# Patient Record
Sex: Female | Born: 1980 | Race: Black or African American | Hispanic: No | Smoking: Never smoker
Health system: Southern US, Community
[De-identification: ages and names within clinical notes are randomized; demographics above are authoritative.]

## PROBLEM LIST (undated history)

## (undated) DIAGNOSIS — M25839 Other specified joint disorders, unspecified wrist: Secondary | ICD-10-CM

## (undated) DIAGNOSIS — G43909 Migraine, unspecified, not intractable, without status migrainosus: Secondary | ICD-10-CM

## (undated) DIAGNOSIS — E282 Polycystic ovarian syndrome: Secondary | ICD-10-CM

## (undated) DIAGNOSIS — I10 Essential (primary) hypertension: Secondary | ICD-10-CM

## (undated) DIAGNOSIS — M549 Dorsalgia, unspecified: Secondary | ICD-10-CM

## (undated) HISTORY — PX: LAPAROSCOPIC BILATERAL SALPINGO OOPHERECTOMY: SHX5890

## (undated) HISTORY — DX: Migraine, unspecified, not intractable, without status migrainosus: G43.909

## (undated) HISTORY — DX: Dorsalgia, unspecified: M54.9

## (undated) HISTORY — PX: REDUCTION MAMMAPLASTY: SUR839

## (undated) HISTORY — DX: Polycystic ovarian syndrome: E28.2

---

## 2005-11-29 ENCOUNTER — Observation Stay: Payer: Self-pay | Admitting: Obstetrics and Gynecology

## 2005-12-06 ENCOUNTER — Observation Stay: Payer: Self-pay | Admitting: Obstetrics and Gynecology

## 2007-03-20 ENCOUNTER — Emergency Department: Payer: Self-pay | Admitting: Emergency Medicine

## 2007-05-31 ENCOUNTER — Emergency Department: Payer: Self-pay | Admitting: Emergency Medicine

## 2008-03-25 ENCOUNTER — Emergency Department: Payer: Self-pay | Admitting: Emergency Medicine

## 2008-09-18 ENCOUNTER — Emergency Department: Payer: Self-pay | Admitting: Internal Medicine

## 2010-05-12 ENCOUNTER — Emergency Department: Payer: Self-pay | Admitting: Emergency Medicine

## 2010-06-03 ENCOUNTER — Emergency Department: Payer: Self-pay | Admitting: Emergency Medicine

## 2011-05-04 ENCOUNTER — Emergency Department: Payer: Self-pay | Admitting: Emergency Medicine

## 2012-09-06 ENCOUNTER — Emergency Department: Payer: Self-pay | Admitting: Emergency Medicine

## 2013-08-08 ENCOUNTER — Ambulatory Visit: Payer: Worker's Compensation

## 2013-08-08 ENCOUNTER — Other Ambulatory Visit: Payer: Self-pay | Admitting: Occupational Medicine

## 2013-08-08 DIAGNOSIS — M25531 Pain in right wrist: Secondary | ICD-10-CM

## 2013-11-29 ENCOUNTER — Other Ambulatory Visit: Payer: Self-pay | Admitting: Orthopedic Surgery

## 2013-12-07 DIAGNOSIS — M25839 Other specified joint disorders, unspecified wrist: Secondary | ICD-10-CM

## 2013-12-07 HISTORY — DX: Other specified joint disorders, unspecified wrist: M25.839

## 2013-12-18 ENCOUNTER — Encounter (HOSPITAL_BASED_OUTPATIENT_CLINIC_OR_DEPARTMENT_OTHER): Payer: Self-pay | Admitting: *Deleted

## 2013-12-21 ENCOUNTER — Ambulatory Visit (HOSPITAL_BASED_OUTPATIENT_CLINIC_OR_DEPARTMENT_OTHER)
Admission: RE | Admit: 2013-12-21 | Discharge: 2013-12-21 | Disposition: A | Discharge status: Home / Self Care | Payer: Worker's Compensation | Source: Ambulatory Visit | Attending: Orthopedic Surgery | Admitting: Orthopedic Surgery

## 2013-12-21 ENCOUNTER — Encounter (HOSPITAL_BASED_OUTPATIENT_CLINIC_OR_DEPARTMENT_OTHER): Payer: Worker's Compensation | Admitting: Anesthesiology

## 2013-12-21 ENCOUNTER — Encounter (HOSPITAL_BASED_OUTPATIENT_CLINIC_OR_DEPARTMENT_OTHER)
Admission: RE | Disposition: A | Discharge status: Home / Self Care | Payer: Self-pay | Source: Ambulatory Visit | Attending: Orthopedic Surgery

## 2013-12-21 ENCOUNTER — Ambulatory Visit (HOSPITAL_BASED_OUTPATIENT_CLINIC_OR_DEPARTMENT_OTHER): Payer: Worker's Compensation | Admitting: Anesthesiology

## 2013-12-21 ENCOUNTER — Encounter (HOSPITAL_BASED_OUTPATIENT_CLINIC_OR_DEPARTMENT_OTHER): Payer: Self-pay | Admitting: *Deleted

## 2013-12-21 DIAGNOSIS — M25539 Pain in unspecified wrist: Secondary | ICD-10-CM | POA: Insufficient documentation

## 2013-12-21 DIAGNOSIS — S63599A Other specified sprain of unspecified wrist, initial encounter: Secondary | ICD-10-CM | POA: Insufficient documentation

## 2013-12-21 DIAGNOSIS — X58XXXA Exposure to other specified factors, initial encounter: Secondary | ICD-10-CM | POA: Insufficient documentation

## 2013-12-21 DIAGNOSIS — S66819A Strain of other specified muscles, fascia and tendons at wrist and hand level, unspecified hand, initial encounter: Principal | ICD-10-CM

## 2013-12-21 HISTORY — PX: ULNA OSTEOTOMY: SHX1077

## 2013-12-21 HISTORY — PX: WRIST ARTHROSCOPY WITH FOVEAL TRIANGULAR FIBROCARTILAGE COMPLEX REPAIR: SHX6403

## 2013-12-21 HISTORY — DX: Other specified joint disorders, unspecified wrist: M25.839

## 2013-12-21 LAB — POCT HEMOGLOBIN-HEMACUE: Hemoglobin: 12.9 g/dL (ref 12.0–15.0)

## 2013-12-21 SURGERY — WRIST ARTHROSCOPY WITH FOVEAL TRIANGULAR FIBROCARTILAGE COMPLEX REPAIR
Anesthesia: Regional | Site: Wrist | Laterality: Right

## 2013-12-21 MED ORDER — ROPIVACAINE HCL 5 MG/ML IJ SOLN
INTRAMUSCULAR | Status: DC | PRN
  Administered 2013-12-21: 25 mL via PERINEURAL

## 2013-12-21 MED ORDER — CEFAZOLIN SODIUM-DEXTROSE 2-3 GM-% IV SOLR: 2.0000 g | INTRAVENOUS | Status: DC

## 2013-12-21 MED ORDER — ONDANSETRON HCL 4 MG/2ML IJ SOLN
INTRAMUSCULAR | Status: DC | PRN
  Administered 2013-12-21: 4 mg via INTRAVENOUS

## 2013-12-21 MED ORDER — FENTANYL CITRATE 0.05 MG/ML IJ SOLN
INTRAMUSCULAR | Status: AC
  Filled 2013-12-21: qty 2

## 2013-12-21 MED ORDER — PROMETHAZINE HCL 25 MG/ML IJ SOLN: 6.2500 mg | INTRAMUSCULAR | Status: DC | PRN

## 2013-12-21 MED ORDER — MIDAZOLAM HCL 2 MG/ML PO SYRP: 12.0000 mg | ORAL_SOLUTION | Freq: Once | ORAL | Status: DC | PRN

## 2013-12-21 MED ORDER — CEFAZOLIN SODIUM-DEXTROSE 2-3 GM-% IV SOLR
2.0000 g | INTRAVENOUS | Status: AC
  Administered 2013-12-21: 2 g via INTRAVENOUS

## 2013-12-21 MED ORDER — OXYCODONE HCL 5 MG PO TABS: 5.0000 mg | ORAL_TABLET | Freq: Once | ORAL | Status: DC | PRN

## 2013-12-21 MED ORDER — KETOROLAC TROMETHAMINE 30 MG/ML IJ SOLN: 15.0000 mg | Freq: Once | INTRAMUSCULAR | Status: DC | PRN

## 2013-12-21 MED ORDER — MIDAZOLAM HCL 2 MG/2ML IJ SOLN
INTRAMUSCULAR | Status: AC
  Filled 2013-12-21: qty 2

## 2013-12-21 MED ORDER — OXYCODONE HCL 5 MG/5ML PO SOLN: 5.0000 mg | Freq: Once | ORAL | Status: DC | PRN

## 2013-12-21 MED ORDER — LIDOCAINE HCL (CARDIAC) 20 MG/ML IV SOLN
INTRAVENOUS | Status: DC | PRN
  Administered 2013-12-21: 60 mg via INTRAVENOUS

## 2013-12-21 MED ORDER — OXYCODONE-ACETAMINOPHEN 10-325 MG PO TABS: 1.0000 | ORAL_TABLET | ORAL | Status: DC | PRN

## 2013-12-21 MED ORDER — FENTANYL CITRATE 0.05 MG/ML IJ SOLN
50.0000 ug | INTRAMUSCULAR | Status: DC | PRN
  Administered 2013-12-21: 100 ug via INTRAVENOUS

## 2013-12-21 MED ORDER — MIDAZOLAM HCL 2 MG/2ML IJ SOLN
1.0000 mg | INTRAMUSCULAR | Status: DC | PRN
Start: 2013-12-21 — End: 2013-12-21
  Administered 2013-12-21: 2 mg via INTRAVENOUS

## 2013-12-21 MED ORDER — CHLORHEXIDINE GLUCONATE 4 % EX LIQD: 60.0000 mL | Freq: Once | CUTANEOUS | Status: DC

## 2013-12-21 MED ORDER — DEXAMETHASONE SODIUM PHOSPHATE 10 MG/ML IJ SOLN
INTRAMUSCULAR | Status: DC | PRN
  Administered 2013-12-21: 10 mg via INTRAVENOUS

## 2013-12-21 MED ORDER — PROPOFOL 10 MG/ML IV BOLUS
INTRAVENOUS | Status: DC | PRN
  Administered 2013-12-21: 180 mg via INTRAVENOUS

## 2013-12-21 MED ORDER — ACETAMINOPHEN 160 MG/5ML PO SOLN: 325.0000 mg | ORAL | Status: DC | PRN

## 2013-12-21 MED ORDER — FENTANYL CITRATE 0.05 MG/ML IJ SOLN: 25.0000 ug | INTRAMUSCULAR | Status: DC | PRN

## 2013-12-21 MED ORDER — ACETAMINOPHEN 325 MG PO TABS: 325.0000 mg | ORAL_TABLET | ORAL | Status: DC | PRN

## 2013-12-21 MED ORDER — CEFAZOLIN SODIUM-DEXTROSE 2-3 GM-% IV SOLR
INTRAVENOUS | Status: AC
  Filled 2013-12-21: qty 50

## 2013-12-21 MED ORDER — LACTATED RINGERS IV SOLN
INTRAVENOUS | Status: DC
  Administered 2013-12-21 (×2): via INTRAVENOUS

## 2013-12-21 MED ORDER — EPHEDRINE SULFATE 50 MG/ML IJ SOLN
INTRAMUSCULAR | Status: DC | PRN
  Administered 2013-12-21: 10 mg via INTRAVENOUS

## 2013-12-21 SURGICAL SUPPLY — 109 items
BIT DRILL 2.8X5 QR DISP (BIT) ×3 IMPLANT
BIT DRILL QUICK RELEASE 3.5MM (BIT) ×1 IMPLANT
BLADE AVERAGE 25MMX9MM (BLADE)
BLADE AVERAGE 25X9 (BLADE) IMPLANT
BLADE CUDA 2.0 (BLADE) IMPLANT
BLADE EAR TYMPAN 2.5 60D BEAV (BLADE) IMPLANT
BLADE MINI RND TIP GREEN BEAV (BLADE) IMPLANT
BLADE SAW OSTEOTOMY (BLADE) ×3 IMPLANT
BLADE SURG 15 STRL LF DISP TIS (BLADE) ×1 IMPLANT
BLADE SURG 15 STRL SS (BLADE) ×2
BNDG COHESIVE 3X5 TAN STRL LF (GAUZE/BANDAGES/DRESSINGS) ×6 IMPLANT
BNDG ESMARK 4X9 LF (GAUZE/BANDAGES/DRESSINGS) ×3 IMPLANT
BNDG GAUZE ELAST 4 BULKY (GAUZE/BANDAGES/DRESSINGS) ×3 IMPLANT
BONE EVOLUTION TRINITY 1CC (Bone Implant) ×3 IMPLANT
BUR CUDA 2.9 (BURR) IMPLANT
BUR CUDA 2.9MM (BURR)
BUR EGG 3PK/BX (BURR) IMPLANT
BUR FULL RADIUS 2.0 (BURR) IMPLANT
BUR FULL RADIUS 2.0MM (BURR)
BUR FULL RADIUS 2.9 (BURR) IMPLANT
BUR FULL RADIUS 2.9MM (BURR)
BUR GATOR 2.9 (BURR) IMPLANT
BUR GATOR 2.9MM (BURR)
BUR SPHERICAL 2.9 (BURR) IMPLANT
BUR SPHERICAL 2.9MM (BURR)
CANISTER SUCT 1200ML W/VALVE (MISCELLANEOUS) IMPLANT
CANISTER SUCT 3000ML (MISCELLANEOUS) IMPLANT
CHLORAPREP W/TINT 26ML (MISCELLANEOUS) ×3 IMPLANT
CORDS BIPOLAR (ELECTRODE) ×3 IMPLANT
COVER MAYO STAND STRL (DRAPES) ×3 IMPLANT
COVER TABLE BACK 60X90 (DRAPES) ×3 IMPLANT
CUFF TOURNIQUET SINGLE 18IN (TOURNIQUET CUFF) ×3 IMPLANT
DECANTER SPIKE VIAL GLASS SM (MISCELLANEOUS) IMPLANT
DRAIN PENROSE 1/2X12 LTX STRL (WOUND CARE) IMPLANT
DRAPE EXTREMITY T 121X128X90 (DRAPE) ×3 IMPLANT
DRAPE OEC MINIVIEW 54X84 (DRAPES) ×3 IMPLANT
DRAPE SURG 17X23 STRL (DRAPES) ×3 IMPLANT
DRAPE U 20/CS (DRAPES) ×3 IMPLANT
DRILL QUICK RELEASE 3.5MM (BIT) ×3
DRSG KUZMA FLUFF (GAUZE/BANDAGES/DRESSINGS) IMPLANT
ELECT SMALL JOINT 90D BASC (ELECTRODE) IMPLANT
GAUZE SPONGE 4X4 12PLY STRL (GAUZE/BANDAGES/DRESSINGS) ×3 IMPLANT
GAUZE SPONGE 4X4 16PLY XRAY LF (GAUZE/BANDAGES/DRESSINGS) ×3 IMPLANT
GAUZE XEROFORM 1X8 LF (GAUZE/BANDAGES/DRESSINGS) ×3 IMPLANT
GLOVE BIO SURGEON STRL SZ7.5 (GLOVE) ×6 IMPLANT
GLOVE BIOGEL M STRL SZ7.5 (GLOVE) ×6 IMPLANT
GLOVE BIOGEL PI IND STRL 7.0 (GLOVE) ×1 IMPLANT
GLOVE BIOGEL PI IND STRL 8 (GLOVE) ×1 IMPLANT
GLOVE BIOGEL PI IND STRL 8.5 (GLOVE) ×1 IMPLANT
GLOVE BIOGEL PI INDICATOR 7.0 (GLOVE) ×2
GLOVE BIOGEL PI INDICATOR 8 (GLOVE) ×2
GLOVE BIOGEL PI INDICATOR 8.5 (GLOVE) ×2
GLOVE ECLIPSE 6.5 STRL STRAW (GLOVE) ×6 IMPLANT
GLOVE SURG ORTHO 8.0 STRL STRW (GLOVE) ×6 IMPLANT
GOWN STRL REUS W/ TWL LRG LVL3 (GOWN DISPOSABLE) ×1 IMPLANT
GOWN STRL REUS W/TWL LRG LVL3 (GOWN DISPOSABLE) ×2
GOWN STRL REUS W/TWL XL LVL3 (GOWN DISPOSABLE) ×9 IMPLANT
GUIDEWIRE ORTHO 0.054X6 (WIRE) ×6 IMPLANT
IV NS IRRIG 3000ML ARTHROMATIC (IV SOLUTION) ×3 IMPLANT
KIT MINI BIO ANCHOR DRILL (KITS) IMPLANT
MANIFOLD NEPTUNE II (INSTRUMENTS) IMPLANT
NDL SAFETY ECLIPSE 18X1.5 (NEEDLE) ×1 IMPLANT
NEEDLE HYPO 18GX1.5 SHARP (NEEDLE) ×2
NEEDLE HYPO 22GX1.5 SAFETY (NEEDLE) ×3 IMPLANT
NEEDLE SPNL 18GX3.5 QUINCKE PK (NEEDLE) IMPLANT
NEEDLE TUOHY 20GX3.5 (NEEDLE) IMPLANT
NS IRRIG 1000ML POUR BTL (IV SOLUTION) ×3 IMPLANT
PACK BASIN DAY SURGERY FS (CUSTOM PROCEDURE TRAY) ×3 IMPLANT
PAD CAST 3X4 CTTN HI CHSV (CAST SUPPLIES) ×1 IMPLANT
PAD CAST 4YDX4 CTTN HI CHSV (CAST SUPPLIES) ×1 IMPLANT
PADDING CAST ABS 3INX4YD NS (CAST SUPPLIES) ×2
PADDING CAST ABS 4INX4YD NS (CAST SUPPLIES)
PADDING CAST ABS COTTON 3X4 (CAST SUPPLIES) ×1 IMPLANT
PADDING CAST ABS COTTON 4X4 ST (CAST SUPPLIES) IMPLANT
PADDING CAST COTTON 3X4 STRL (CAST SUPPLIES) ×2
PADDING CAST COTTON 4X4 STRL (CAST SUPPLIES) ×2
PLATE ULNAR SHORTENING (Plate) ×3 IMPLANT
ROUTER HOODED VORTEX 2.9MM (BLADE) IMPLANT
SCREW HEXALOBE NON-LOCK 3.5X16 (Screw) ×3 IMPLANT
SCREW NLCKG 13 3.5X13 HEXA (Screw) ×2 IMPLANT
SCREW NON-LOCK 3.5X13 (Screw) ×6 IMPLANT
SCREW NONLOCK HEX 3.5X12 (Screw) ×12 IMPLANT
SET ARTHROSCOPY TUBING (MISCELLANEOUS) ×2
SET ARTHROSCOPY TUBING LN (MISCELLANEOUS) ×1 IMPLANT
SET EXT MALE ROTATING LL 32IN (MISCELLANEOUS) ×3 IMPLANT
SET SM JOINT TUBING/CANN (CANNULA) IMPLANT
SLEEVE SCD COMPRESS KNEE MED (MISCELLANEOUS) ×3 IMPLANT
SLING ARM LRG ADULT FOAM STRAP (SOFTGOODS) IMPLANT
SLING ARM MED ADULT FOAM STRAP (SOFTGOODS) IMPLANT
SPLINT PLASTER CAST XFAST 3X15 (CAST SUPPLIES) ×30 IMPLANT
SPLINT PLASTER XTRA FASTSET 3X (CAST SUPPLIES) ×60
STOCKINETTE 4X48 STRL (DRAPES) ×9 IMPLANT
SUCTION FRAZIER TIP 10 FR DISP (SUCTIONS) IMPLANT
SUT MERSILENE 4 0 P 3 (SUTURE) IMPLANT
SUT PDS AB 2-0 CT2 27 (SUTURE) IMPLANT
SUT STEEL 4 0 (SUTURE) IMPLANT
SUT VIC AB 2-0 PS2 27 (SUTURE) ×3 IMPLANT
SUT VICRYL 4-0 PS2 18IN ABS (SUTURE) ×3 IMPLANT
SUT VICRYL RAPID 5 0 P 3 (SUTURE) IMPLANT
SUT VICRYL RAPIDE 4/0 PS 2 (SUTURE) ×3 IMPLANT
SYR BULB 3OZ (MISCELLANEOUS) ×3 IMPLANT
SYR CONTROL 10ML LL (SYRINGE) ×3 IMPLANT
TOWEL OR 17X24 6PK STRL BLUE (TOWEL DISPOSABLE) ×3 IMPLANT
TUBE CONNECTING 20'X1/4 (TUBING)
TUBE CONNECTING 20X1/4 (TUBING) IMPLANT
UNDERPAD 30X30 INCONTINENT (UNDERPADS AND DIAPERS) ×3 IMPLANT
WAND 1.5 MICROBLATOR (SURGICAL WAND) ×3 IMPLANT
WATER STERILE IRR 1000ML POUR (IV SOLUTION) ×3 IMPLANT
WIRE TACK PLATE PL-PTACK (WIRE) ×3 IMPLANT

## 2013-12-21 NOTE — Discharge Instructions (Addendum)
[facility] Instructions °Hand Surgery ° °Wound Care: °Keep your hand elevated above the level of your heart.  Do not allow it to dangle by your side.  Keep the dressing dry and do not remove it unless your doctor advises you to do so.  He will usually change it at the time of your post-op visit.  Moving your fingers is advised to stimulate circulation but will depend on the site of your surgery.  If you have a splint applied, your doctor will advise you regarding movement. ° °Activity: °Do not drive or operate machinery today.  Rest today and then you may return to your normal activity and work as indicated by your physician. ° °Diet:  °Drink liquids today or eat a light diet.  You may resume a regular diet tomorrow.   ° °General expectations: °Pain for two to three days. °Fingers may become slightly swollen. ° °Call your doctor if any of the following occur: °Severe pain not relieved by pain medication. °Elevated temperature. °Dressing soaked with blood. °Inability to move fingers. °White or bluish color to fingers. ° ° °Post Anesthesia Home Care Instructions ° °Activity: °Get plenty of rest for the remainder of the day. A responsible adult should stay with you for 24 hours following the procedure.  °For the next 24 hours, DO NOT: °-Drive a car °-Operate machinery °-Drink alcoholic beverages °-Take any medication unless instructed by your physician °-Make any legal decisions or sign important papers. ° °Meals: °Start with liquid foods such as gelatin or soup. Progress to regular foods as tolerated. Avoid greasy, spicy, heavy foods. If nausea and/or vomiting occur, drink only clear liquids until the nausea and/or vomiting subsides. Call your physician if vomiting continues. ° °Special Instructions/Symptoms: °Your throat may feel dry or sore from the anesthesia or the breathing tube placed in your throat during surgery. If this causes discomfort, gargle with warm salt water. The discomfort should disappear within 24  hours. ° ° °Regional Anesthesia Blocks ° °1. Numbness or the inability to move the "blocked" extremity may last from 3-48 hours after placement. The length of time depends on the medication injected and your individual response to the medication. If the numbness is not going away after 48 hours, call your surgeon. ° °2. The extremity that is blocked will need to be protected until the numbness is gone and the  Strength has returned. Because you cannot feel it, you will need to take extra care to avoid injury. Because it may be weak, you may have difficulty moving it or using it. You may not know what position it is in without looking at it while the block is in effect. ° °3. For blocks in the legs and feet, returning to weight bearing and walking needs to be done carefully. You will need to wait until the numbness is entirely gone and the strength has returned. You should be able to move your leg and foot normally before you try and bear weight or walk. You will need someone to be with you when you first try to ensure you do not fall and possibly risk injury. ° °4. Bruising and tenderness at the needle site are common side effects and will resolve in a few days. ° °5. Persistent numbness or new problems with movement should be communicated to the surgeon or the [facility] ([phone])/ [facility] ([phone]). °

## 2013-12-21 NOTE — Anesthesia Preprocedure Evaluation (Signed)
Anesthesia Evaluation  Patient identified by MRN, date of birth, ID band Patient awake    Reviewed: Allergy & Precautions, H&P , NPO status , Patient's Chart, lab work & pertinent test results  History of Anesthesia Complications Negative for: history of anesthetic complications  Airway Mallampati: III TM Distance: >3 FB Neck ROM: Full    Dental  (+) Teeth Intact   Pulmonary neg pulmonary ROS,          Cardiovascular negative cardio ROS  Rhythm:Regular     Neuro/Psych negative neurological ROS     GI/Hepatic negative GI ROS, Neg liver ROS,   Endo/Other  Morbid obesity  Renal/GU negative Renal ROS     Musculoskeletal   Abdominal   Peds  Hematology negative hematology ROS (+)   Anesthesia Other Findings   Reproductive/Obstetrics                           Anesthesia Physical Anesthesia Plan  ASA: II  Anesthesia Plan: General and Regional   Post-op Pain Management:    Induction: Intravenous  Airway Management Planned: LMA  Additional Equipment: None  Intra-op Plan:   Post-operative Plan: Extubation in OR  Informed Consent: I have reviewed the patients History and Physical, chart, labs and discussed the procedure including the risks, benefits and alternatives for the proposed anesthesia with the patient or authorized representative who has indicated his/her understanding and acceptance.   Dental advisory given  Plan Discussed with: CRNA and Surgeon  Anesthesia Plan Comments:         Anesthesia Quick Evaluation

## 2013-12-21 NOTE — H&P (Signed)
Ms. Martha Grant is a 33 year-old right-hand dominant female who suffered an injury to her right wrist in July 23, 2013.  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  She localizes this to the central aspect.  She has been seen and followed by [facility].  She was given a splint to wear at work, therapy and ibuprofen which has given her some relief, but she continues to complain of pain and has been referred.  She has no prior history of injury, no history of diabetes, thyroid problems, arthritis or gout.  There is a family history of diabetes, she has not been tested.  She complains of an extremely severe sharp pain, it does not awaken her at night.  She is not complaining of any numbness or tingling.  She has no complaints with her left side.  She has had her MRI done and this reveals that she has an ulnocarpal abutment, tear of the triangular fibrocartilage, the ulna is long, the lunotriquetral joint appears intact along with scapholunate ligament.    ALLERGIES:        None. MEDICATIONS:        None. SURGICAL HISTORY:      C-sections.  FAMILY MEDICAL HISTORY:      Positive for diabetes, high blood pressure, otherwise negative. SOCIAL HISTORY:       She does not smoke or drink.  She is single, works as a LawyerCNA. REVIEW OF SYSTEMS:       Negative 14 points Martha Grant is an 33 y.o. female.   Chief Complaint: Ulno-carpal abutment right wrist HPI: see above  Past Medical History  Diagnosis Date  . 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 Cesarean section      x 2    History reviewed. No pertinent family history. Social History:  reports that she has never smoked. She has never used smokeless tobacco. She reports that she does not drink alcohol or use illicit drugs.  Allergies: No Known Allergies  Medications Prior to Admission  Medication Sig Dispense Refill  . ibuprofen  (ADVIL,MOTRIN) 200 MG tablet Take 200 mg by mouth every 6 (six) hours as needed.        Results for orders placed during the hospital encounter of 12/21/13 (from the past 48 hour(s))  POCT HEMOGLOBIN-HEMACUE     Status: None   Collection Time    12/21/13 11:01 AM      Result Value Ref Range   Hemoglobin 12.9  12.0 - 15.0 g/dL    No results found.   Pertinent items are noted in HPI.  Blood pressure 129/74, pulse 58, temperature 98.1 F (36.7 C), temperature source Oral, resp. rate 19, height 5\' 4"  (1.626 m), weight 98.431 kg (217 lb), last menstrual period 12/18/2013, SpO2 100.00%.  General appearance: alert, cooperative and appears stated age Head: Normocephalic, without obvious abnormality Neck: no JVD Resp: clear to auscultation bilaterally Cardio: regular rate and rhythm, S1, S2 normal, no murmur, click, rub or gallop GI: soft, non-tender; bowel sounds normal; no masses,  no organomegaly Extremities: extremities normal, atraumatic, no cyanosis or edema Pulses: 2+ and symmetric Skin: Skin color, texture, turgor normal. No rashes or lesions Neurologic: Grossly normal Incision/Wound: na  Assessment/Plan  We have had a long discussion with respect to ulnocarpal abutment, its etiology, treatment is ulnar shortening osteotomy after arthroscopic debridement  to be certain she does not have further injuries especially to the lunotriquetral joint.    We have discussed the pre, peri and postoperative course, the risks and complications with the major risk being delayed or nonunion of the osteotomy.  She states that if she lifts things she will frequently get a pain going down her ulna.  She is advised that the length discrepancy was not caused by the injury, that this is hereditary, but that the injury has aggravated her problem.  She is also aware of potential for injury to arteries, nerves and tendons, incomplete relief of symptoms, dystrophy, the possibility of infection,  nonunion Nicki ReaperGary R Elene Downum 12/21/2013, 12:28 PM

## 2013-12-21 NOTE — Op Note (Signed)
Dictation Number [id number] Intra-operative fluoroscopic images in the AP, lateral, and oblique views were taken and evaluated by myself.  Reduction and hardware placement were confirmed.

## 2013-12-21 NOTE — Progress Notes (Signed)
Assisted Dr. Moser with right, ultrasound guided, supraclavicular block. Side rails up, monitors on throughout procedure. See vital signs in flow sheet. Tolerated Procedure well. °

## 2013-12-21 NOTE — Brief Op Note (Signed)
12/21/2013  3:16 PM  PATIENT:  Glee ArvinLatoya Boddy  33 y.o. female  PRE-OPERATIVE DIAGNOSIS:  ULNOCARPAL ABUTMENT RIGHT WRIST  POST-OPERATIVE DIAGNOSIS:  Ulnocarpal Abuttment Right Wrist  PROCEDURE:  Procedure(s): ARTHROSCOPY DEBRIDEMENT TRIANGULAR CARTILAGE COMPLEX RIGHT WRIST (Right) OPEN ULNAR SHORTENING OSTEOTOMY RIGHT  (Right)  SURGEON:  Surgeon(s) and Role:    * Nicki ReaperGary R Keilee Denman, MD - Primary    * Tami RibasKevin R Khamil Lamica, MD - Assisting  PHYSICIAN ASSISTANT:   ASSISTANTS: K Surabhi Gadea md   ANESTHESIA:   regional and general  EBL:  Total I/O In: 1000 [I.V.:1000] Out: -   BLOOD ADMINISTERED:none  DRAINS: none   LOCAL MEDICATIONS USED:  NONE  SPECIMEN:  No Specimen  DISPOSITION OF SPECIMEN:  N/A  COUNTS:  YES  TOURNIQUET:   Total Tourniquet Time Documented: Upper Arm (Right) - 67 minutes Total: Upper Arm (Right) - 67 minutes   DICTATION: .Other Dictation: Dictation Number [id number]  PLAN OF CARE: Discharge to home after PACU  PATIENT DISPOSITION:  PACU - hemodynamically stable.

## 2013-12-21 NOTE — Anesthesia Postprocedure Evaluation (Signed)
  Anesthesia Post-op Note  Patient: Martha Grant  Procedure(s) Performed: Procedure(s): ARTHROSCOPY DEBRIDEMENT TRIANGULAR CARTILAGE COMPLEX RIGHT WRIST (Right) OPEN ULNAR SHORTENING OSTEOTOMY RIGHT  (Right)  Patient Location: PACU  Anesthesia Type:GA combined with regional for post-op pain  Level of Consciousness: awake and alert   Airway and Oxygen Therapy: Patient Spontanous Breathing  Post-op Pain: none  Post-op Assessment: Post-op Vital signs reviewed, Patient's Cardiovascular Status Stable, Respiratory Function Stable, Patent Airway, NAUSEA AND VOMITING PRESENT and Pain level controlled  Post-op Vital Signs: Reviewed and stable  Last Vitals:  Filed Vitals:   12/21/13 1545  BP: 133/77  Pulse: 97  Temp:   Resp: 12    Complications: No apparent anesthesia complications

## 2013-12-21 NOTE — Transfer of Care (Signed)
Immediate Anesthesia Transfer of Care Note  Patient: Martha Grant  Procedure(s) Performed: Procedure(s): ARTHROSCOPY DEBRIDEMENT TRIANGULAR CARTILAGE COMPLEX RIGHT WRIST (Right) OPEN ULNAR SHORTENING OSTEOTOMY RIGHT  (Right)  Patient Location: PACU  Anesthesia Type:GA combined with regional for post-op pain  Level of Consciousness: sedated and patient cooperative  Airway & Oxygen Therapy: Patient Spontanous Breathing and Patient connected to face mask oxygen  Post-op Assessment: Report given to PACU RN and Post -op Vital signs reviewed and stable  Post vital signs: Reviewed and stable  Complications: No apparent anesthesia complications

## 2013-12-21 NOTE — Anesthesia Procedure Notes (Addendum)
Anesthesia Regional Block:  Supraclavicular block  Pre-Anesthetic Checklist: ,, timeout performed, Correct Patient, Correct Site, Correct Laterality, Correct Procedure, Correct Position, site marked, Risks and benefits discussed,  Surgical consent,  Pre-op evaluation,  At surgeon's request and post-op pain management  Laterality: Upper and Right  Prep: chloraprep       Needles:  Injection technique: Single-shot  Needle Type: Echogenic Needle          Additional Needles:  Procedures: ultrasound guided (picture in chart) Supraclavicular block Narrative:  Start time: 12/21/2013 11:55 AM End time: 12/21/2013 12:02 PM Injection made incrementally with aspirations every 5 mL.  Performed by: Personally  Anesthesiologist: Moser  Additional Notes: H+P and labs reviewed, risks and benefits discussed with patient, procedure tolerated well without complications   Procedure Name: LMA Insertion Date/Time: 12/21/2013 1:21 PM Performed by: Burna CashONRAD, Anael Rosch C Pre-anesthesia Checklist: Patient identified, Emergency Drugs available, Suction available and Patient being monitored Patient Re-evaluated:Patient Re-evaluated prior to inductionOxygen Delivery Method: Circle System Utilized Preoxygenation: Pre-oxygenation with 100% oxygen Intubation Type: IV induction Ventilation: Mask ventilation without difficulty LMA: LMA inserted LMA Size: 4.0 Number of attempts: 1 Airway Equipment and Method: bite block Placement Confirmation: positive ETCO2 Tube secured with: Tape Dental Injury: Teeth and Oropharynx as per pre-operative assessment

## 2013-12-22 NOTE — Op Note (Signed)
NAMClayton Grant:  Grant, Martha                 ACCOUNT NO.:  [id number]  MEDICAL RECORD NO.:  [id number]  LOCATION:                                 FACILITY:  PHYSICIAN:  Cindee SaltGary Jakarri Lesko, M.D.            DATE OF BIRTH:  DATE OF PROCEDURE:  12/21/2013 DATE OF DISCHARGE:                              OPERATIVE REPORT   PREOPERATIVE DIAGNOSES:  Ulnocarpal abutment, triangular fibrocartilage complex tear, right wrist.  POSTOPERATIVE DIAGNOSES:  Ulnocarpal abutment, triangular fibrocartilage complex tear, right wrist plus Geissler III scapholunate ligament injury, right wrist.  OPERATION:  Arthroscopy with debridement of triangular fibrocartilage complex, shrinkage of scapholunate with ulnar shortening osteotomy open right wrist, right ulna.  SURGEON:  Cindee SaltGary Pratham Cassatt, M.D.  ASSISTANT:  Betha LoaKevin Halana Deisher, MD  ANESTHESIA:  Supraclavicular block, general.  HISTORY:  The patient is a 33 year old female with a history of an injury to her right wrist.  She has undergone an MRI revealing a TFCC tear.  She is admitted for ulnar shortening osteotomy and that she has shows changes on the ulnar aspect of her lunate with protrusion of dye. She has pain on the radial aspect of her wrist with the possibility of scapholunate ligament injury, however, this was not borne out on MRI. She is aware of risks and complications including infection, recurrence of injury to arteries, nerves, tendons, incomplete relief of symptoms, and dystrophy.  In preoperative area, the patient is seen, the extremity marked by both the patient and surgeon.  Antibiotic given.  PROCEDURE IN DETAIL:  The patient was brought to the operating room, where a general anesthetic was carried out without difficulty.  She had a supraclavicular block in the preoperative area.  She was prepped using ChloraPrep, supine position, right arm free.  A 3-minute dry time was allowed.  Time-out taken, confirming the patient and procedure.  She was placed in the  arthroscopy tower, 10 pounds of traction applied.  The joint inflated to 3 to 4 portals.  Transverse incision made, deepened with a hemostat.  Blunt trocar used to enter the joint.  The joint was inspected.  A scapholunate ligament injury was immediately apparent. This was a Building services engineerGeissler type III with the scope not quite able to be passed through the intercarpal ligament.  The TFCC was found to be torn.  A lunotriquetral injury was also noted of relatively minor as compared to the scapholunate ligament injury.  The irrigation catheter was placed in 6 view, 4-5 portal opened, the TFCC was then debrided with an ArthroWand taking care to provide adequate drainage.  The scope was introduced in the 4-5 portal.  A partial tear of the lunotriquetral was also noted. This was relatively small.  The scapholunate was then shrunk and a partial synovectomy performed with the ArthroWand.  The midcarpal joint was then inspected.  There were no significant changes on the articular surface of the capitate.  The scapholunate did not show significant instability following the shrinkage.  The volar wrist ligaments were intact as were noted in the proximal row.  The scope was then removed after the debridement.  The limb exsanguinated with an Esmarch bandage. Tourniquet placed  high and the arm was inflated to 250 mmHg.  An incision was then made over the ulnar aspect of the wrist, carried down through subcutaneous tissue.  A dorsal sensory ulnar nerve was protected.  Retractor was placed and incision made between the flexor carpi ulnaris and extensor carpi ulnaris.  The pronator quadratus was elevated off.  An Acumed ulnar shortening plate was then applied, clamped in position.  Drill holes were made distally, screws attached. Drill holes were made proximally and the plate stabilized.  With the reduction clamp in the most proximal hole with pins being placed for placement of the osteotomy guide with constant  irrigation a 3 mm segment of ulna was removed.  The most distal proximal screw was then inserted, this allowed the osteotomy site to be compressed along with the 2 guides being placed to allow a compression clamp to be placed over this.  The osteotomy site was realigned with a clamp.  Good compression was noted. The oblique screw was then placed, this measured 16 mm.  The remaining screws had been 12 mm distally, 13 mm proximally.  This firmly compressed the osteotomy site.  The remaining screws were placed.  These each measured 13 mm proximally and 12 mm distally.  X-rays confirmed that there was very good compression of the osteotomy site with it being barely visible on the image intensification.  Plate lied in good position.  The screws did not appear long.  The wound was irrigated. Trinity bone graft had been soaked.  This was then mixed, drained, and then placed as a bone graft.  The periosteum was then closed with figure- of-eight 2-0 Vicryl sutures along with the pronator quadratus.  The subcutaneous tissue and fascia was closed in layers with 2-0 Vicryl in the fascia and 4-0 Vicryl in the subcutaneous tissue.  Skin closed with interrupted 4-0 Vicryl Rapide sutures, this included the portals for the arthroscopy.  A sterile compressive dressing, long-arm splint was applied.  Full pronation and supination was noted of prior to closure of the wound.  The patient tolerated the procedure well and was taken to the recovery room for observation in a satisfactory condition.  She will be discharged home [facility] [place] [facility] [place] Percoc[place].          ______________________________ Cindee SaltGary Kyri Dai, M.D.     GK/MEDQ  D:  12/21/2013  T:  12/22/2013  Job:  [id number]

## 2013-12-26 ENCOUNTER — Encounter (HOSPITAL_BASED_OUTPATIENT_CLINIC_OR_DEPARTMENT_OTHER): Payer: Self-pay | Admitting: Orthopedic Surgery

## 2014-01-08 IMAGING — CR DG WRIST COMPLETE 3+V*R*
4 series · 4 of 4 positions shown · non-contrast
Comparison: None.

CLINICAL DATA: Right wrist pain, injury.

EXAM:
RIGHT WRIST - COMPLETE 3+ VIEW

[view not recorded (1 of 4)]
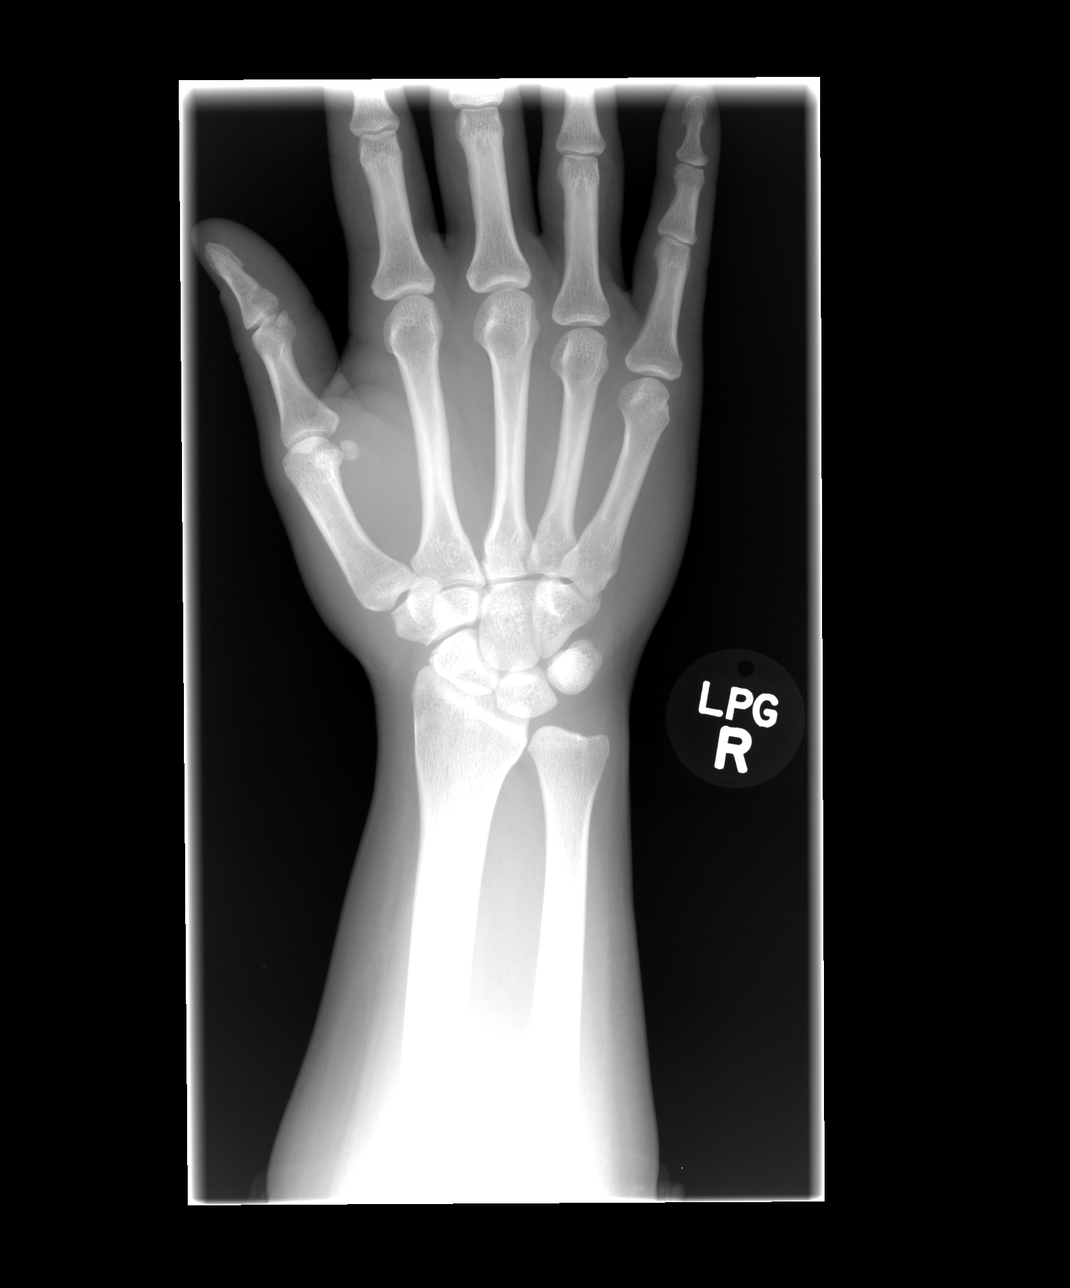

[view not recorded (2 of 4)]
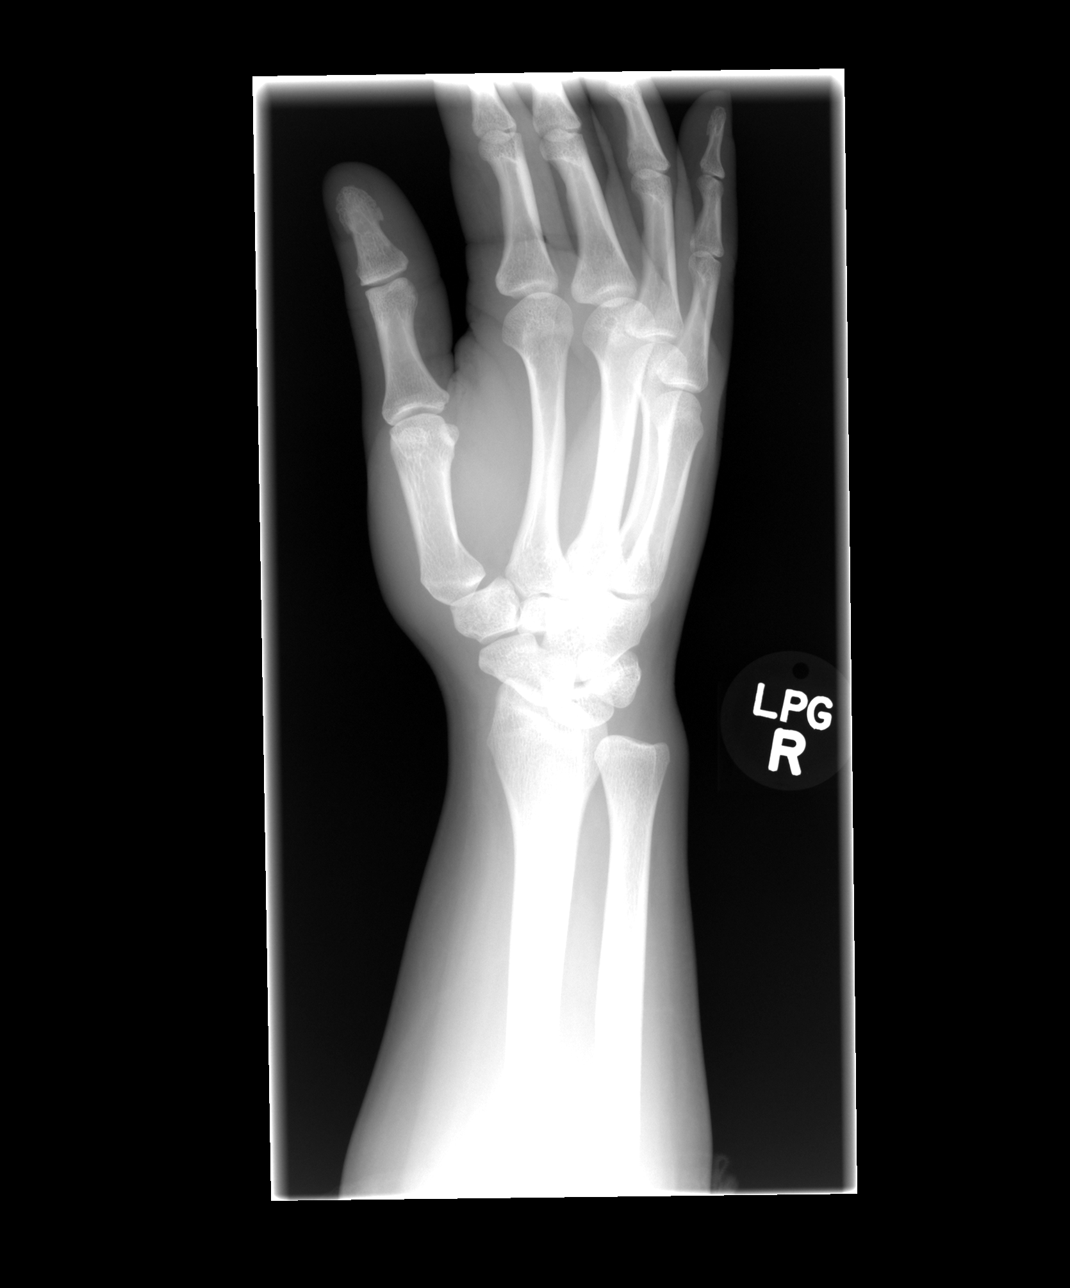

[view not recorded (3 of 4)]
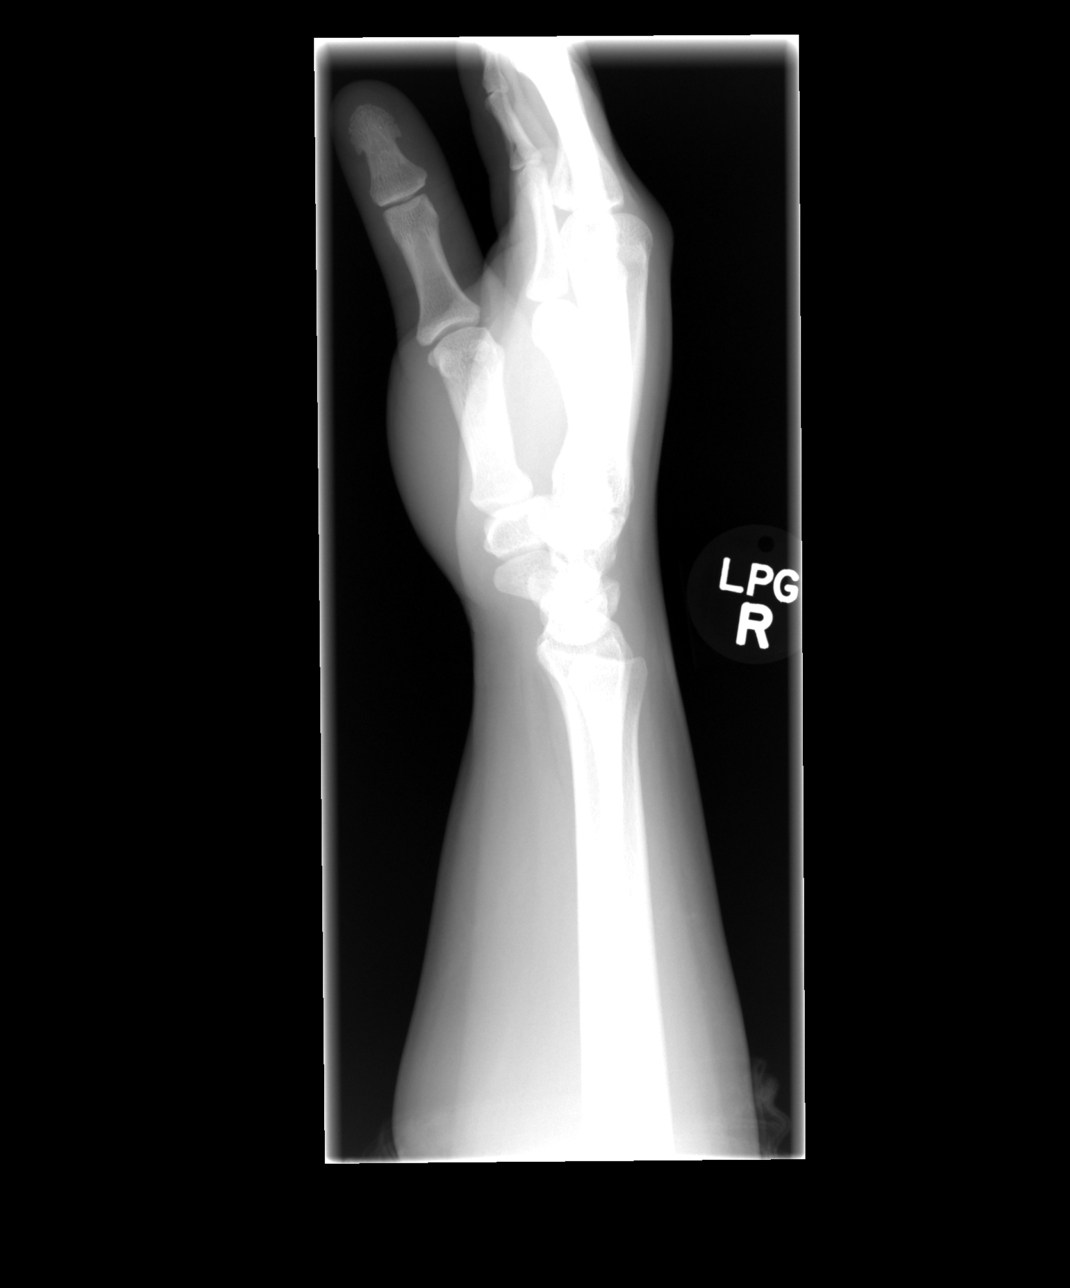

[view not recorded (4 of 4)]
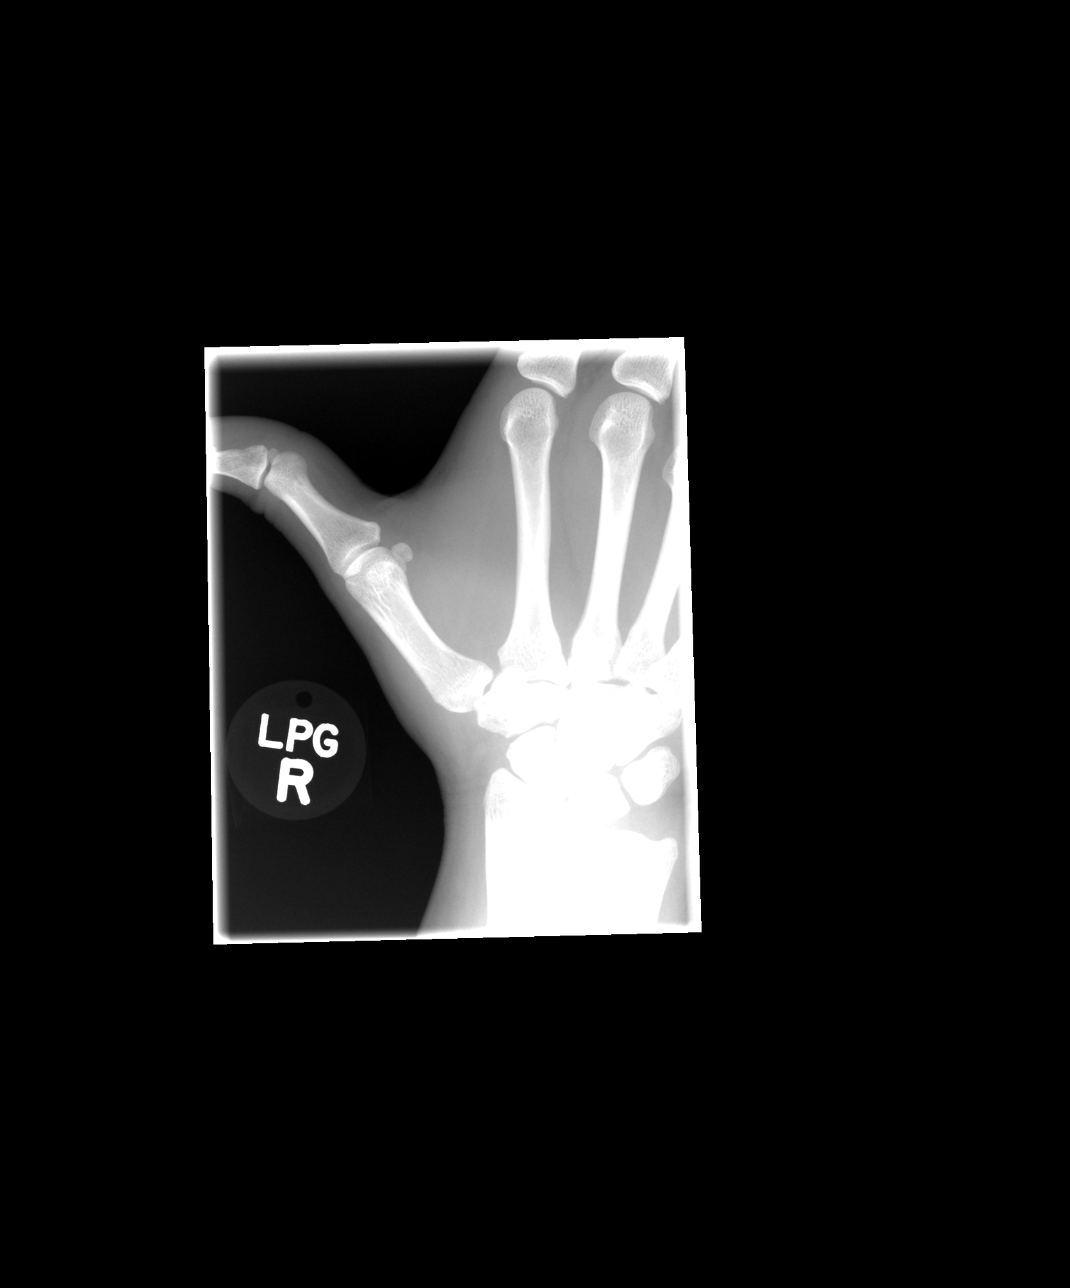

[4 of 4 positions shown; findings below may reference images not displayed]

FINDINGS: There is no evidence of fracture or dislocation. There is no
evidence of arthropathy or other focal bone abnormality. Soft
tissues are unremarkable.
IMPRESSION: Negative.

## 2015-06-21 ENCOUNTER — Encounter: Payer: Self-pay | Admitting: Emergency Medicine

## 2015-06-21 ENCOUNTER — Ambulatory Visit
Admission: EM | Admit: 2015-06-21 | Discharge: 2015-06-21 | Disposition: A | Discharge status: Home / Self Care | Payer: Self-pay | Attending: Family Medicine | Admitting: Family Medicine

## 2015-06-21 DIAGNOSIS — J039 Acute tonsillitis, unspecified: Secondary | ICD-10-CM

## 2015-06-21 DIAGNOSIS — K529 Noninfective gastroenteritis and colitis, unspecified: Secondary | ICD-10-CM

## 2015-06-21 DIAGNOSIS — H6123 Impacted cerumen, bilateral: Secondary | ICD-10-CM

## 2015-06-21 LAB — RAPID STREP SCREEN (MED CTR MEBANE ONLY): Streptococcus, Group A Screen (Direct): NEGATIVE

## 2015-06-21 MED ORDER — PREDNISONE 20 MG PO TABS: 40.0000 mg | ORAL_TABLET | Freq: Every day | ORAL | Status: AC

## 2015-06-21 MED ORDER — ONDANSETRON HCL 8 MG PO TABS: 8.0000 mg | ORAL_TABLET | Freq: Two times a day (BID) | ORAL | Status: DC | PRN

## 2015-06-21 MED ORDER — FIRST-DUKES MOUTHWASH MT SUSP: 15.0000 mL | Freq: Three times a day (TID) | OROMUCOSAL | Status: DC | PRN

## 2015-06-21 MED ORDER — ACETAMINOPHEN 500 MG PO TABS
1000.0000 mg | ORAL_TABLET | Freq: Four times a day (QID) | ORAL | Status: DC | PRN
Start: 2015-06-21 — End: 2017-11-13

## 2015-06-21 NOTE — ED Notes (Signed)
Patient presents here with c/o sore throat since Thursday, denies any fever

## 2015-06-21 NOTE — Discharge Instructions (Signed)
Viral Gastroenteritis °Viral gastroenteritis is also known as stomach flu. This condition affects the stomach and intestinal tract. It can cause sudden diarrhea and vomiting. The illness typically lasts 3 to 8 days. Most people develop an immune response that eventually gets rid of the virus. While this natural response develops, the virus can make you quite ill. °CAUSES  °Many different viruses can cause gastroenteritis, such as rotavirus or noroviruses. You can catch one of these viruses by consuming contaminated food or water. You may also catch a virus by sharing utensils or other personal items with an infected person or by touching a contaminated surface. °SYMPTOMS  °The most common symptoms are diarrhea and vomiting. These problems can cause a severe loss of body fluids (dehydration) and a body salt (electrolyte) imbalance. Other symptoms may include: °· Fever. °· Headache. °· Fatigue. °· Abdominal pain. °DIAGNOSIS  °Your caregiver can usually diagnose viral gastroenteritis based on your symptoms and a physical exam. A stool sample may also be taken to test for the presence of viruses or other infections. °TREATMENT  °This illness typically goes away on its own. Treatments are aimed at rehydration. The most serious cases of viral gastroenteritis involve vomiting so severely that you are not able to keep fluids down. In these cases, fluids must be given through an intravenous line (IV). °HOME CARE INSTRUCTIONS  °· Drink enough fluids to keep your urine clear or pale yellow. Drink small amounts of fluids frequently and increase the amounts as tolerated. °· Ask your caregiver for specific rehydration instructions. °· Avoid: °¨ Foods high in sugar. °¨ Alcohol. °¨ Carbonated drinks. °¨ Tobacco. °¨ Juice. °¨ Caffeine drinks. °¨ Extremely hot or cold fluids. °¨ Fatty, greasy foods. °¨ Too much intake of anything at one time. °¨ Dairy products until 24 to 48 hours after diarrhea stops. °· You may consume probiotics.  Probiotics are active cultures of beneficial bacteria. They may lessen the amount and number of diarrheal stools in adults. Probiotics can be found in yogurt with active cultures and in supplements. °· Wash your hands well to avoid spreading the virus. °· Only take over-the-counter or prescription medicines for pain, discomfort, or fever as directed by your caregiver. Do not give aspirin to children. Antidiarrheal medicines are not recommended. °· Ask your caregiver if you should continue to take your regular prescribed and over-the-counter medicines. °· Keep all follow-up appointments as directed by your caregiver. °SEEK IMMEDIATE MEDICAL CARE IF:  °· You are unable to keep fluids down. °· You do not urinate at least once every 6 to 8 hours. °· You develop shortness of breath. °· You notice blood in your stool or vomit. This may look like coffee grounds. °· You have abdominal pain that increases or is concentrated in one small area (localized). °· You have persistent vomiting or diarrhea. °· You have a fever. °· The patient is a child younger than 3 months, and he or she has a fever. °· The patient is a child older than 3 months, and he or she has a fever and persistent symptoms. °· The patient is a child older than 3 months, and he or she has a fever and symptoms suddenly get worse. °· The patient is a baby, and he or she has no tears when crying. °MAKE SURE YOU:  °· Understand these instructions. °· Will watch your condition. °· Will get help right away if you are not doing well or get worse. °  °This information is not intended to replace   advice given to you by your health care provider. Make sure you discuss any questions you have with your health care provider.   Document Released: 07/26/2005 Document Revised: 10/18/2011 Document Reviewed: 05/12/2011 Elsevier Interactive Patient Education 2016 Elsevier Inc. Cerumen Impaction The structures of the external ear canal secrete a waxy substance known as  cerumen. Excess cerumen can build up in the ear canal, causing a condition known as cerumen impaction. Cerumen impaction can cause ear pain and disrupt the function of the ear. The rate of cerumen production differs for each individual. In certain individuals, the configuration of the ear canal may decrease his or her ability to naturally remove cerumen. CAUSES Cerumen impaction is caused by excessive cerumen production or buildup. RISK FACTORS  Frequent use of swabs to clean ears.  Having narrow ear canals.  Having eczema.  Being dehydrated. SIGNS AND SYMPTOMS  Diminished hearing.  Ear drainage.  Ear pain.  Ear itch. TREATMENT Treatment may involve:  Over-the-counter or prescription ear drops to soften the cerumen.  Removal of cerumen by a health care provider. This may be done with:  Irrigation with warm water. This is the most common method of removal.  Ear curettes and other instruments.  Surgery. This may be done in severe cases. HOME CARE INSTRUCTIONS  Take medicines only as directed by your health care provider.  Do not insert objects into the ear with the intent of cleaning the ear. PREVENTION  Do not insert objects into the ear, even with the intent of cleaning the ear. Removing cerumen as a part of normal hygiene is not necessary, and the use of swabs in the ear canal is not recommended.  Drink enough water to keep your urine clear or pale yellow.  Control your eczema if you have it. SEEK MEDICAL CARE IF:  You develop ear pain.  You develop bleeding from the ear.  The cerumen does not clear after you use ear drops as directed.   This information is not intended to replace advice given to you by your health care provider. Make sure you discuss any questions you have with your health care provider.   Document Released: 09/02/2004 Document Revised: 08/16/2014 Document Reviewed: 03/12/2015 Elsevier Interactive Patient Education 2016 Elsevier  Inc. Tonsillitis Tonsillitis is an infection of the throat that causes the tonsils to become red, tender, and swollen. Tonsils are collections of lymphoid tissue at the back of the throat. Each tonsil has crevices (crypts). Tonsils help fight nose and throat infections and keep infection from spreading to other parts of the body for the first 18 months of life.  CAUSES Sudden (acute) tonsillitis is usually caused by infection with streptococcal bacteria. Long-lasting (chronic) tonsillitis occurs when the crypts of the tonsils become filled with pieces of food and bacteria, which makes it easy for the tonsils to become repeatedly infected. SYMPTOMS  Symptoms of tonsillitis include:  A sore throat, with possible difficulty swallowing.  White patches on the tonsils.  Fever.  Tiredness.  New episodes of snoring during sleep, when you did not snore before.  Small, foul-smelling, yellowish-white pieces of material (tonsilloliths) that you occasionally cough up or spit out. The tonsilloliths can also cause you to have bad breath. DIAGNOSIS Tonsillitis can be diagnosed through a physical exam. Diagnosis can be confirmed with the results of lab tests, including a throat culture. TREATMENT  The goals of tonsillitis treatment include the reduction of the severity and duration of symptoms and prevention of associated conditions. Symptoms of tonsillitis can be  improved with the use of steroids to reduce the swelling. Tonsillitis caused by bacteria can be treated with antibiotic medicines. Usually, treatment with antibiotic medicines is started before the cause of the tonsillitis is known. However, if it is determined that the cause is not bacterial, antibiotic medicines will not treat the tonsillitis. If attacks of tonsillitis are severe and frequent, your health care provider may recommend surgery to remove the tonsils (tonsillectomy). HOME CARE INSTRUCTIONS   Rest as much as possible and get plenty of  sleep.  Drink plenty of fluids. While the throat is very sore, eat soft foods or liquids, such as sherbet, soups, or instant breakfast drinks.  Eat frozen ice pops.  Gargle with a warm or cold liquid to help soothe the throat. Mix 1/4 teaspoon of salt and 1/4 teaspoon of baking soda in 8 oz of water. SEEK MEDICAL CARE IF:   Large, tender lumps develop in your neck.  A rash develops.  A green, yellow-brown, or bloody substance is coughed up.  You are unable to swallow liquids or food for 24 hours.  You notice that only one of the tonsils is swollen. SEEK IMMEDIATE MEDICAL CARE IF:   You develop any new symptoms such as vomiting, severe headache, stiff neck, chest pain, or trouble breathing or swallowing.  You have severe throat pain along with drooling or voice changes.  You have severe pain, unrelieved with recommended medications.  You are unable to fully open the mouth.  You develop redness, swelling, or severe pain anywhere in the neck.  You have a fever. MAKE SURE YOU:   Understand these instructions.  Will watch your condition.  Will get help right away if you are not doing well or get worse.   This information is not intended to replace advice given to you by your health care provider. Make sure you discuss any questions you have with your health care provider.   Document Released: 05/05/2005 Document Revised: 08/16/2014 Document Reviewed: 01/12/2013 Elsevier Interactive Patient Education [employer] Elsevier Inc.

## 2015-06-22 NOTE — ED Provider Notes (Signed)
CSN: [id number]     Arrival date & time 06/21/15  1205 History   First MD Initiated Contact with Patient 06/21/15 1333     Chief Complaint  Patient presents with  . 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    Past Medical History  Diagnosis Date  . 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 Cesarean section      x 2  . Wrist arthroscopy with foveal triangular fibrocartilage complex repair Right 12/21/2013    Procedure: ARTHROSCOPY DEBRIDEMENT TRIANGULAR CARTILAGE COMPLEX RIGHT WRIST;  Surgeon: Nicki Reaper, MD;  Location: [facility];  Service: Orthopedics;  Laterality: Right;  . Ulna osteotomy Right 12/21/2013    Procedure: OPEN ULNAR SHORTENING OSTEOTOMY RIGHT ;  Surgeon: Nicki Reaper, MD;  Location: [facility];  Service: Orthopedics;  Laterality: Right;   History reviewed. No pertinent family history. Social History  Substance Use Topics  . Smoking status: Never Smoker   . Smokeless tobacco: Never Used  . Alcohol Use: No   OB History    No data available     Review of Systems  Constitutional: Positive for appetite change. Negative for fever, chills, diaphoresis, activity change, fatigue and unexpected weight change.  HENT: Positive for congestion and sore throat. Negative for dental problem, drooling, ear discharge, ear pain, facial swelling, hearing loss, mouth sores, nosebleeds, postnasal drip, rhinorrhea, sinus pressure, sneezing, tinnitus, trouble swallowing and  voice change.   Eyes: Negative for photophobia, pain, discharge, redness, itching and visual disturbance.  Respiratory: Negative for cough, choking, chest tightness, shortness of breath, wheezing and stridor.   Cardiovascular: Negative for chest pain, palpitations and leg swelling.  Gastrointestinal: Positive for nausea and vomiting. Negative for abdominal pain, diarrhea, constipation, blood in stool and abdominal distention.  Endocrine: Negative for cold intolerance and heat intolerance.  Genitourinary: Negative for dysuria, hematuria and difficulty urinating.  Musculoskeletal: Negative for myalgias, back pain, joint swelling, arthralgias, gait problem, neck pain and neck stiffness.  Skin: Positive for rash. Negative for color change, pallor and wound.  Allergic/Immunologic: Negative for environmental allergies and food allergies.  Neurological: Positive for headaches. Negative for dizziness, tremors, seizures, syncope, facial asymmetry, speech difficulty, weakness, light-headedness and numbness.  Hematological: Negative for adenopathy. Does not bruise/bleed easily.  Psychiatric/Behavioral: Negative for behavioral problems, confusion, sleep disturbance and agitation.    Allergies  Review of patient's allergies indicates no known allergies.  Home Medications   Prior to Admission medications   Medication Sig Start Date End Date Taking? Authorizing Provider  acetaminophen (TYLENOL) 500 MG tablet Take 2 tablets (1,000 mg total) by mouth every 6 (six) hours as needed for moderate pain or fever. 06/21/15   Barbaraann Barthel, NP  Diphenhyd-Hydrocort-Nystatin (FIRST-DUKES MOUTHWASH) SUSP Use as directed 15 mLs in the mouth or throat 3 (three) times daily as needed. 06/21/15   Barbaraann Barthel, NP  ondansetron (ZOFRAN) 8 MG tablet Take 1 tablet (8 mg total) by mouth 2 (two) times daily as needed for nausea or vomiting. 06/21/15   Barbaraann Barthel,  NP  predniSONE (DELTASONE) 20 MG tablet Take 2  tablets (40 mg total) by mouth daily with breakfast. 06/22/15 06/27/15  Barbaraann Barthel, NP   Meds Ordered and Administered this Visit  Medications - No data to display  BP 141/84 mmHg  Pulse 95  Temp(Src) 97.1 F (36.2 C) (Oral)  Resp 20  Ht  (1.626 m)  Wt 200 lb (90.719 kg)  BMI 34.31 kg/m2  SpO2 100%  LMP 06/05/2015 No data found.   Physical Exam  Constitutional: She is oriented to person, place, and time. She appears well-developed and well-nourished. She is active and cooperative.  Non-toxic appearance. She does not have a sickly appearance. She appears ill. No distress.  HENT:  Head: Normocephalic and atraumatic.  Right Ear: Hearing, external ear and ear canal normal.  Left Ear: Hearing, external ear and ear canal normal.  Nose: Mucosal edema and rhinorrhea present. No nose lacerations, sinus tenderness, nasal deformity, septal deviation or nasal septal hematoma. No epistaxis.  No foreign bodies. Right sinus exhibits no maxillary sinus tenderness and no frontal sinus tenderness. Left sinus exhibits no maxillary sinus tenderness and no frontal sinus tenderness.  Mouth/Throat: Uvula is midline and mucous membranes are normal. Mucous membranes are not pale, not dry and not cyanotic. She does not have dentures. No oral lesions. No trismus in the jaw. Normal dentition. No dental abscesses, uvula swelling, lacerations or dental caries. Posterior oropharyngeal edema and posterior oropharyngeal erythema present. No oropharyngeal exudate or tonsillar abscesses.  Bilateral tonsils 4+/4 not touching uvula or each other no exudate; cobblestoning posterior pharynx; brown cerumen adjacent to TM obscuring TM; canal without debris or erythema bilaterally; bilateral nasal turbinates with edema/erythema  Eyes: Conjunctivae, EOM and lids are normal. Pupils are equal, round, and reactive to light. Right eye exhibits no chemosis, no discharge, no exudate and no hordeolum. No foreign body present  in the right eye. Left eye exhibits no chemosis, no discharge, no exudate and no hordeolum. No foreign body present in the left eye. Right conjunctiva is not injected. Right conjunctiva has no hemorrhage. Left conjunctiva is not injected. Left conjunctiva has no hemorrhage. No scleral icterus. Right eye exhibits normal extraocular motion and no nystagmus. Left eye exhibits normal extraocular motion and no nystagmus. Right pupil is round and reactive. Left pupil is round and reactive. Pupils are equal.  Neck: Trachea normal and normal range of motion. Neck supple. No tracheal tenderness, no spinous process tenderness and no muscular tenderness present. No rigidity. No tracheal deviation, no edema, no erythema and normal range of motion present. No thyroid mass and no thyromegaly present.  Cardiovascular: Normal rate, regular rhythm, S1 normal, S2 normal, normal heart sounds and intact distal pulses.  PMI is not displaced.  Exam reveals no gallop and no friction rub.   No murmur heard. Pulmonary/Chest: Effort normal and breath sounds normal. No accessory muscle usage or stridor. No respiratory distress. She has no decreased breath sounds. She has no wheezes. She has no rhonchi. She has no rales. She exhibits no tenderness.  Abdominal: Soft. Bowel sounds are normal. She exhibits no shifting dullness, no distension, no pulsatile liver, no fluid wave, no abdominal bruit, no ascites and no mass. There is no hepatosplenomegaly. There is generalized tenderness. There is no rigidity, no rebound, no guarding, no CVA tenderness, no tenderness at McBurney's point and negative Murphy's sign. Hernia confirmed negative in the ventral area.  Musculoskeletal: Normal range of motion. She exhibits no edema or tenderness.  Right shoulder: Normal.       Left shoulder: Normal.       Right hip: Normal.       Left hip: Normal.       Right knee: Normal.       Left knee: Normal.       Cervical back: Normal.       Right  hand: Normal.       Left hand: Normal.  Lymphadenopathy:       Head (right side): No submental, no submandibular, no tonsillar, no preauricular, no posterior auricular and no occipital adenopathy present.       Head (left side): No submental, no submandibular, no tonsillar, no preauricular, no posterior auricular and no occipital adenopathy present.    She has no cervical adenopathy.       Right cervical: No superficial cervical, no deep cervical and no posterior cervical adenopathy present.      Left cervical: No superficial cervical, no deep cervical and no posterior cervical adenopathy present.  Neurological: She is alert and oriented to person, place, and time. She has normal strength. She is not disoriented. She displays no atrophy and no tremor. No cranial nerve deficit or sensory deficit. She exhibits normal muscle tone. She displays no seizure activity. Coordination and gait normal. GCS eye subscore is 4. GCS verbal subscore is 5. GCS motor subscore is 6.  Skin: Skin is warm, dry and intact. Rash noted. No abrasion, no bruising, no burn, no ecchymosis, no laceration, no lesion, no petechiae and no purpura noted. Rash is vesicular. Rash is not macular, not papular, not maculopapular, not nodular, not pustular and not urticarial. She is not diaphoretic. No cyanosis or erythema. No pallor. Nails show no clubbing.     Psychiatric: She has a normal mood and affect. Her speech is normal and behavior is normal. Judgment and thought content normal. Cognition and memory are normal.  Nursing note and vitals reviewed.   ED Course  Procedures (including critical care time)  Labs Review Labs Reviewed  [facility]EN [facility])  CULTURE, GROUP A STREP ([facility] ONLY)    Imaging Review No results found. 1345 notified rapid strep negative and will call with throat culture results typically 48 hours.  Patient verbalized understanding of information/instructions, agreed with plan of care and had  no further questions at this time.  MDM   1. Acute tonsillitis, unspecified etiology   2. Cerumen impaction, bilateral   3. Gastroenteritis, acute    [facility] mouthwash Rx given 15ml gargle and swallow TID prn sore throat.  Prednisone 40mg  po daily with breakfast x 5 days start tomorrow. Tylenol 1000mg  po QID prn pain/fever. Will call with throat culture results once available typically 48 hours.  If dyspnea/dysphagia patient to go to ER for re-evaluation discussed signs and symptoms of tonsillar abscess with patient.  Usually no specific medical treatment is needed if a virus is causing the sore throat.  The throat most often gets better on its own within 5 to 7 days.  Antibiotic medicine does not cure viral pharyngitis.   For acute pharyngitis caused by bacteria, your healthcare provider will prescribe an antibiotic.  Marland Kitchen. Do not smoke.  Marland Kitchen. Avoid secondhand smoke and other air pollutants.  . Use a cool mist humidifier to add moisture to the air.  . Get plenty of rest.  . You may want to rest your throat by talking less and eating a diet that is mostly liquid or soft for a  day or two.   Marland Kitchen Nonprescription throat lozenges and mouthwashes should help relieve the soreness.   . Gargling with warm saltwater and drinking warm liquids may help.  (You can make a saltwater solution by adding 1/4 teaspoon of salt to 8 ounces, or 240 mL, of warm water.)  . A nonprescription pain reliever such as aspirin, acetaminophen, or ibuprofen may ease general aches and pains.   FOLLOW UP with clinic provider if no improvements in the next 7-10 days.  Patient verbalized understanding of instructions and agreed with plan of care. P2:  Hand washing and diet.  Patient did not want cerumen removal in clinic.  Wears earbuds at work frequently.  Try mineral oil on cotton ball (soaked) weekly in ear for 20 minutes and when in clinic have provider check ears to see if buildup with this method again.  Exitcare handout on cerumen  impaction given to patient.  Discussed purpose of earwax with patient.  Avoid cotton applicator (Q-tip) use in ears.  Patient verbalized understanding, agreed with plan of care and had no further questions at this time.    Rx given for zofran 8mg  ODT po BID prn nausea/vomiting.  I have recommended clear fluids and bland diet.  Avoid dairy/spicy, fried and large portions of meat while having nausea.  If vomiting hold po intake x 1 hour.  Then sips clear fluids like broths, ginger ale, power ade, gatorade, pedialyte may advance to soft/bland if no vomiting x 24 hours and appetite returned otherwise hydration main focus.     Return to the clinic if symptoms persist or worsen; I have alerted the patient to call if high fever, dehydration, marked weakness, fainting, increased abdominal pain, blood in stool or vomit (red or black).   Exitcare handout on gastroenteritis given to patient.   Patient verbalized agreement and understanding of treatment plan and had no further questions at this time.  Barbaraann Barthel, NP 06/22/15 [phone]

## 2015-06-24 LAB — CULTURE, GROUP A STREP (THRC)

## 2015-06-25 ENCOUNTER — Telehealth: Payer: Self-pay | Admitting: Family Medicine

## 2015-06-25 MED ORDER — PENICILLIN V POTASSIUM 500 MG PO TABS: 500.0000 mg | ORAL_TABLET | Freq: Two times a day (BID) | ORAL | Status: DC

## 2015-06-25 NOTE — Telephone Encounter (Signed)
Reviewed labs + strep pyogenes penicillin v 500mg  po BID x 10 days sent to [facility] [place] to pick up.  Attempted to notify patient no answer and no voicemail set up for her number [phone].  Letter sent.

## 2017-11-08 ENCOUNTER — Other Ambulatory Visit: Payer: Self-pay | Admitting: Primary Care

## 2017-11-08 DIAGNOSIS — N6323 Unspecified lump in the left breast, lower outer quadrant: Secondary | ICD-10-CM

## 2017-11-13 ENCOUNTER — Ambulatory Visit
Admission: EM | Admit: 2017-11-13 | Discharge: 2017-11-13 | Disposition: A | Discharge status: Home / Self Care | Payer: BC Managed Care – PPO | Attending: Family Medicine | Admitting: Family Medicine

## 2017-11-13 ENCOUNTER — Other Ambulatory Visit: Payer: Self-pay

## 2017-11-13 ENCOUNTER — Encounter: Payer: Self-pay | Admitting: Emergency Medicine

## 2017-11-13 DIAGNOSIS — J0100 Acute maxillary sinusitis, unspecified: Secondary | ICD-10-CM | POA: Diagnosis not present

## 2017-11-13 DIAGNOSIS — J209 Acute bronchitis, unspecified: Secondary | ICD-10-CM | POA: Diagnosis not present

## 2017-11-13 MED ORDER — AMOXICILLIN-POT CLAVULANATE 875-125 MG PO TABS: 1.0000 | ORAL_TABLET | Freq: Two times a day (BID) | ORAL | 0 refills | Status: AC

## 2017-11-13 MED ORDER — BENZONATATE 100 MG PO CAPS: 100.0000 mg | ORAL_CAPSULE | Freq: Three times a day (TID) | ORAL | 0 refills | Status: DC | PRN

## 2017-11-13 NOTE — ED Triage Notes (Signed)
Patient c/o cough, congestion and SOB that started a week ago.  Patient denies fevers.  °

## 2017-11-13 NOTE — ED Provider Notes (Signed)
[facility]    CSN: [id number] Arrival date & time: 11/13/17  1451     History   Chief Complaint Chief Complaint  Patient presents with  . Cough    HPI Martha Grant is a 37 y.o. female.   37 year old female presents with nasal congestion, cough and chest congestion for over 1 week. Also having chills, nausea, decreased appetite and energy. Denies any fever, vomiting or diarrhea. Sinus pressure has gotten worse in the past few days and now coughing up green mucus. Has tried various OTC cold and mucus medications with minimal relief. Does not smoke. No other family members ill. No other chronic health issues. Takes no daily medication.   The history is provided by the patient.    Past Medical History:  Diagnosis Date  . 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   Past Surgical History:  Procedure Laterality Date  . CESAREAN SECTION     x 2  . ULNA OSTEOTOMY Right 12/21/2013   Procedure: OPEN ULNAR SHORTENING OSTEOTOMY RIGHT ;  Surgeon: Nicki ReaperGary R Kuzma, MD;  Location: [facility];  Service: Orthopedics;  Laterality: Right;  . WRIST ARTHROSCOPY WITH FOVEAL TRIANGULAR FIBROCARTILAGE COMPLEX REPAIR Right 12/21/2013   Procedure: ARTHROSCOPY DEBRIDEMENT TRIANGULAR CARTILAGE COMPLEX RIGHT WRIST;  Surgeon: Nicki ReaperGary R Kuzma, MD;  Location: [facility];  Service: Orthopedics;  Laterality: Right;    OB History   None      Home Medications    Prior to Admission medications   Medication Sig Start Date End Date Taking? Authorizing Provider  amoxicillin-clavulanate (AUGMENTIN) 875-125 MG tablet Take 1 tablet by mouth every 12 (twelve) hours for 7 days. 11/13/17 11/20/17  Sudie GrumblingAmyot, Ann Berry, NP  benzonatate (TESSALON) 100 MG capsule Take 1 capsule (100 mg total) by mouth 3 (three) times daily as needed for cough. 11/13/17   Sudie GrumblingAmyot, Ann Berry, NP    Family History History reviewed. No  pertinent family history.  Social History Social History   Tobacco Use  . Smoking status: Never Smoker  . Smokeless tobacco: Never Used  Substance Use Topics  . Alcohol use: No  . Drug use: No     Allergies   Patient has no known allergies.   Review of Systems Review of Systems  Constitutional: Positive for activity change, appetite change, chills and fatigue. Negative for diaphoresis and fever.  HENT: Positive for congestion, postnasal drip, rhinorrhea, sinus pressure, sinus pain and sore throat. Negative for ear discharge, ear pain, hearing loss, mouth sores, nosebleeds, sneezing and trouble swallowing.   Eyes: Negative for pain, discharge, redness and itching.  Respiratory: Positive for cough, chest tightness and shortness of breath. Negative for wheezing.   Gastrointestinal: Positive for nausea. Negative for abdominal pain, diarrhea and vomiting.  Musculoskeletal: Negative for arthralgias, myalgias, neck pain and neck stiffness.  Skin: Negative for rash and wound.  Neurological: Positive for headaches. Negative for dizziness, seizures, syncope, weakness, light-headedness and numbness.  Hematological: Negative for adenopathy. Does not bruise/bleed easily.  Psychiatric/Behavioral: Negative.      Physical Exam Triage Vital Signs ED Triage Vitals  Enc Vitals Group     BP 11/13/17 1501 (!) 144/99     Pulse Rate 11/13/17 1501 93     Resp 11/13/17 1501 16     Temp 11/13/17 1501 98.2 F (36.8 C)     Temp Source 11/13/17 1501 Oral  SpO2 11/13/17 1501 98 %     Weight 11/13/17 1459 230 lb (104.3 kg)     Height 11/13/17 1459 5\' 4"  (1.626 m)     Head Circumference --      Peak Flow --      Pain Score 11/13/17 1459 6     Pain Loc --      Pain Edu? --      Excl. in GC? --    No data found.  Updated Vital Signs BP (!) 144/99 (BP Location: Left Arm)   Pulse 93   Temp 98.2 F (36.8 C) (Oral)   Resp 16   Ht 5\' 4"  (1.626 m)   Wt 230 lb (104.3 kg)   LMP 09/28/2017    SpO2 98%   BMI 39.48 kg/m   Visual Acuity Right Eye Distance:   Left Eye Distance:   Bilateral Distance:    Right Eye Near:   Left Eye Near:    Bilateral Near:     Physical Exam  Constitutional: She is oriented to person, place, and time. She appears well-developed and well-nourished. No distress.  HENT:  Head: Normocephalic and atraumatic.  Right Ear: Hearing, tympanic membrane and external ear normal. A foreign body (yellow cerumen blocking most of right ear canal) is present.  Left Ear: Hearing, external ear and ear canal normal. Tympanic membrane is bulging. Tympanic membrane is not injected, not erythematous and not retracted. A middle ear effusion is present.  Nose: Mucosal edema and rhinorrhea present. Right sinus exhibits maxillary sinus tenderness. Right sinus exhibits no frontal sinus tenderness. Left sinus exhibits maxillary sinus tenderness. Left sinus exhibits no frontal sinus tenderness.  Mouth/Throat: Uvula is midline and mucous membranes are normal. Posterior oropharyngeal erythema present.  Eyes: Conjunctivae and EOM are normal.  Neck: Normal range of motion. Neck supple.  Cardiovascular: Normal rate, regular rhythm and normal heart sounds.  No murmur heard. Pulmonary/Chest: Effort normal. No respiratory distress. She has decreased breath sounds in the right upper field, the right lower field, the left upper field and the left lower field. She has no wheezes. She has rhonchi in the right upper field, the right lower field, the left upper field and the left lower field. She has no rales.  Musculoskeletal: Normal range of motion.  Lymphadenopathy:    She has no cervical adenopathy.  Neurological: She is alert and oriented to person, place, and time.  Skin: Skin is warm and dry. Capillary refill takes less than 2 seconds. No rash noted.  Psychiatric: She has a normal mood and affect. Her behavior is normal. Judgment and thought content normal.  Vitals reviewed.    UC  Treatments / Results  Labs (all labs ordered are listed, but only abnormal results are displayed) Labs Reviewed - No data to display  EKG None Radiology No results found.  Procedures Procedures (including critical care time)  Medications Ordered in UC Medications - No data to display   Initial Impression / Assessment and Plan / UC Course  I have reviewed the triage vital signs and the nursing notes.  Pertinent labs & imaging results that were available during my care of the patient were reviewed by me and considered in my medical decision making (see chart for details).    Discussed that she has a sinus infection and mild bronchitis. Recommend start Augmentin 875mg  twice a day as directed. Increase fluid intake to help loosen up mucus. Encouraged to cough up mucus during the day. May take Tessalon cough  pills 1 every 8 hours as needed. Note written for work. Follow-up here in 3 to 4 days if not improving.    Final Clinical Impressions(s) / UC Diagnoses   Final diagnoses:  Acute non-recurrent maxillary sinusitis  Acute bronchitis, unspecified organism    ED Discharge Orders        Ordered    amoxicillin-clavulanate (AUGMENTIN) 875-125 MG tablet  Every 12 hours     11/13/17 1516    benzonatate (TESSALON) 100 MG capsule  3 times daily PRN     11/13/17 1516       Controlled Substance Prescriptions [place] Controlled Substance Registry consulted? Not Applicable   Sudie Grumbling, NP 11/13/17 1526

## 2017-11-13 NOTE — Discharge Instructions (Addendum)
Recommend start Augmentin 875mg  twice a day as directed. Increase fluid intake to help loosen up mucus. May take Tessalon cough pills 1 every 8 hours as needed. Follow-up here in 3 to 4 days if not improving.

## 2017-11-18 ENCOUNTER — Ambulatory Visit
Admission: RE | Admit: 2017-11-18 | Discharge: 2017-11-18 | Disposition: A | Discharge status: Home / Self Care | Payer: BC Managed Care – PPO | Source: Ambulatory Visit | Attending: Primary Care | Admitting: Primary Care

## 2017-11-18 DIAGNOSIS — N6323 Unspecified lump in the left breast, lower outer quadrant: Secondary | ICD-10-CM

## 2018-04-14 ENCOUNTER — Ambulatory Visit
Admission: EM | Admit: 2018-04-14 | Discharge: 2018-04-14 | Disposition: A | Discharge status: Home / Self Care | Payer: BC Managed Care – PPO | Attending: Emergency Medicine | Admitting: Emergency Medicine

## 2018-04-14 ENCOUNTER — Ambulatory Visit (INDEPENDENT_AMBULATORY_CARE_PROVIDER_SITE_OTHER): Payer: BC Managed Care – PPO

## 2018-04-14 ENCOUNTER — Other Ambulatory Visit: Payer: Self-pay

## 2018-04-14 ENCOUNTER — Encounter: Payer: Self-pay | Admitting: Emergency Medicine

## 2018-04-14 DIAGNOSIS — S93422A Sprain of deltoid ligament of left ankle, initial encounter: Secondary | ICD-10-CM | POA: Diagnosis not present

## 2018-04-14 NOTE — ED Provider Notes (Signed)
[facility]    CSN: [id number] Arrival date & time: 04/14/18  1952     History   Chief Complaint Chief Complaint  Patient presents with  . Ankle Pain    left    HPI Martha Grant is a 37 y.o. female.   HPI  37 year old female states that this morning she walked out of her house and possibly stepped in a hole twisting her left ankle into eversion and plantar flexion.   She heard a crack.  She had pain in the ankle since that time.  She is had difficulty walking but is able to walk.  The movement of the ankle and plantar or dorsiflexion is uncomfortable.  Not appear to be a large amount of swelling.         Past Medical History:  Diagnosis Date  . 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   Past Surgical History:  Procedure Laterality Date  . CESAREAN SECTION     x 2  . ULNA OSTEOTOMY Right 12/21/2013   Procedure: OPEN ULNAR SHORTENING OSTEOTOMY RIGHT ;  Surgeon: Nicki Reaper, MD;  Location: [facility];  Service: Orthopedics;  Laterality: Right;  . WRIST ARTHROSCOPY WITH FOVEAL TRIANGULAR FIBROCARTILAGE COMPLEX REPAIR Right 12/21/2013   Procedure: ARTHROSCOPY DEBRIDEMENT TRIANGULAR CARTILAGE COMPLEX RIGHT WRIST;  Surgeon: Nicki Reaper, MD;  Location: [facility];  Service: Orthopedics;  Laterality: Right;    OB History   None      Home Medications    Prior to Admission medications   Not on File    Family History History reviewed. No pertinent family history.  Social History Social History   Tobacco Use  . Smoking status: Never Smoker  . Smokeless tobacco: Never Used  Substance Use Topics  . Alcohol use: No  . Drug use: No     Allergies   Patient has no known allergies.   Review of Systems Review of Systems  Constitutional: Positive for activity change. Negative for appetite change, chills, fatigue and fever.  Musculoskeletal:  Positive for arthralgias and gait problem.  All other systems reviewed and are negative.    Physical Exam Triage Vital Signs ED Triage Vitals  Enc Vitals Group     BP 04/14/18 2013 (!) 151/93     Pulse Rate 04/14/18 2013 72     Resp 04/14/18 2013 16     Temp 04/14/18 2013 98.3 F (36.8 C)     Temp Source 04/14/18 2013 Oral     SpO2 04/14/18 2013 98 %     Weight 04/14/18 2012 230 lb (104.3 kg)     Height 04/14/18 2012 5\' 4"  (1.626 m)     Head Circumference --      Peak Flow --      Pain Score 04/14/18 2012 5     Pain Loc --      Pain Edu? --      Excl. in GC? --    No data found.  Updated Vital Signs BP (!) 151/93 (BP Location: Left Arm)   Pulse 72   Temp 98.3 F (36.8 C) (Oral)   Resp 16   Ht 5\' 4"  (1.626 m)   Wt 230 lb (104.3 kg)   LMP 02/23/2018 (Approximate)   SpO2 98%   BMI 39.48 kg/m   Visual Acuity Right Eye Distance:   Left Eye Distance:   Bilateral  Distance:    Right Eye Near:   Left Eye Near:    Bilateral Near:     Physical Exam  Constitutional: She is oriented to person, place, and time. She appears well-developed and well-nourished. No distress.  HENT:  Head: Normocephalic.  Eyes: Pupils are equal, round, and reactive to light. Right eye exhibits no discharge. Left eye exhibits no discharge.  Neck: Normal range of motion.  Musculoskeletal: Normal range of motion. She exhibits tenderness.  Exam of the left ankle shows no swelling erythema.  Or skin disruption.  Has marked limitation of motion due to pain.  Subtalar motion is intact.  Excellent tenderness is sharply look localized over the anterior fibers of the deltoid ligament.  She has no tenderness of the medial or lateral malleolus.  There is no tenderness over the anterior fibulotalar ligament or the ligaments.  There is no midfoot or forefoot pain or tenderness.  Neurological: She is alert and oriented to person, place, and time.  Skin: Skin is warm and dry. She is not diaphoretic.    Psychiatric: She has a normal mood and affect. Her behavior is normal. Thought content normal.  Nursing note and vitals reviewed.    UC Treatments / Results  Labs (all labs ordered are listed, but only abnormal results are displayed) Labs Reviewed - No data to display  EKG None  Radiology Dg Ankle Complete Left  Result Date: 04/14/2018 CLINICAL DATA:  Ankle pain and swelling EXAM: LEFT ANKLE COMPLETE - 3+ VIEW COMPARISON:  None. FINDINGS: There is no evidence of fracture, dislocation, or joint effusion. There is no evidence of arthropathy or other focal bone abnormality. Soft tissues are unremarkable. IMPRESSION: Negative. Electronically Signed   By: Jasmine Pang M.D.   On: 04/14/2018 20:31    Procedures Procedures (including critical care time)  Medications Ordered in UC Medications - No data to display  Initial Impression / Assessment and Plan / UC Course  I have reviewed the triage vital signs and the nursing notes.  Pertinent labs & imaging results that were available during my care of the patient were reviewed by me and considered in my medical decision making (see chart for details).     Plan: 1. Test/x-ray results and diagnosis reviewed with patient 2. rx as per orders; risks, benefits, potential side effects reviewed with patient 3. Recommend supportive treatment with an elevation to control pain and swelling.  Provided with an Aircast splint for stability.  Given her restrictions at work for the next 7 days.  Use ibuprofen for discomfort.  Is not improving she should follow-up with her primary care physician. 4. F/u prn if symptoms worsen or don't improve  Final Clinical Impressions(s) / UC Diagnoses   Final diagnoses:  Sprain of deltoid ligament of left ankle, initial encounter     Discharge Instructions     Apply ice 20 minutes out of every 2 hours 4-5 times daily for comfort.  Weight as necessary above the level of your heart for swelling and pain.  Use  ibuprofen for pain.  Not improving in 2 weeks follow-up with your primary care or orthopedist   ED Prescriptions    None     Controlled Substance Prescriptions [place] Controlled Substance Registry consulted? Not Applicable   Lutricia Feil, PA-C 04/14/18 2113

## 2018-04-14 NOTE — ED Triage Notes (Signed)
Patient states that she stepped in a hole and heard a crack in her left ankle.  Patient c/o pain in her left ankle.

## 2018-04-14 NOTE — Discharge Instructions (Signed)
Apply ice 20 minutes out of every 2 hours 4-5 times daily for comfort.  Weight as necessary above the level of your heart for swelling and pain.  Use ibuprofen for pain.  Not improving in 2 weeks follow-up with your primary care or orthopedist

## 2018-10-12 ENCOUNTER — Ambulatory Visit
Admission: EM | Admit: 2018-10-12 | Discharge: 2018-10-12 | Disposition: A | Discharge status: Home / Self Care | Payer: BC Managed Care – PPO | Attending: Family Medicine | Admitting: Family Medicine

## 2018-10-12 ENCOUNTER — Encounter: Payer: Self-pay | Admitting: Emergency Medicine

## 2018-10-12 ENCOUNTER — Other Ambulatory Visit: Payer: Self-pay

## 2018-10-12 DIAGNOSIS — J988 Other specified respiratory disorders: Secondary | ICD-10-CM | POA: Diagnosis not present

## 2018-10-12 DIAGNOSIS — J029 Acute pharyngitis, unspecified: Secondary | ICD-10-CM | POA: Diagnosis not present

## 2018-10-12 DIAGNOSIS — R05 Cough: Secondary | ICD-10-CM

## 2018-10-12 DIAGNOSIS — B9789 Other viral agents as the cause of diseases classified elsewhere: Secondary | ICD-10-CM

## 2018-10-12 DIAGNOSIS — M791 Myalgia, unspecified site: Secondary | ICD-10-CM | POA: Diagnosis not present

## 2018-10-12 LAB — RAPID INFLUENZA A&B ANTIGENS: Influenza A (ARMC): NEGATIVE

## 2018-10-12 LAB — RAPID INFLUENZA A&B ANTIGENS (ARMC ONLY): INFLUENZA B (ARMC): NEGATIVE

## 2018-10-12 MED ORDER — NAPROXEN 500 MG PO TABS: 500.0000 mg | ORAL_TABLET | Freq: Two times a day (BID) | ORAL | 0 refills | Status: DC | PRN

## 2018-10-12 NOTE — Discharge Instructions (Signed)
Rest.   Fluids.  Medication as directed.  Take care  Dr. Jaeley Wiker  

## 2018-10-12 NOTE — ED Provider Notes (Signed)
[facility]    CSN: [id number] Arrival date & time: 10/12/18  1412  History   Chief Complaint Chief Complaint  Patient presents with  . Generalized Body Aches  . Headache   HPI  38 year old female presents with the above complaints.  Symptoms started yesterday. Patient reports headache, body aches, sore throat. Temp 99.6 yesterday. Son has recently been sick with similar symptoms.  No documented flu exposure.  No medications or interventions tried.  She states that her most troublesome symptoms are the headache and body aches.  She reports chills.  No known exacerbating factors.  No other complaints.  PMH, Surgical Hx, Family Hx, Social History reviewed and updated as below.  Past Medical History:  Diagnosis Date  . 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 CESAREAN SECTION     x 2  . ULNA OSTEOTOMY Right 12/21/2013   Procedure: OPEN ULNAR SHORTENING OSTEOTOMY RIGHT ;  Surgeon: Nicki Reaper, MD;  Location: [facility];  Service: Orthopedics;  Laterality: Right;  . WRIST ARTHROSCOPY WITH FOVEAL TRIANGULAR FIBROCARTILAGE COMPLEX REPAIR Right 12/21/2013   Procedure: ARTHROSCOPY DEBRIDEMENT TRIANGULAR CARTILAGE COMPLEX RIGHT WRIST;  Surgeon: Nicki Reaper, MD;  Location: [facility];  Service: Orthopedics;  Laterality: Right;   OB History   No obstetric history on file.    Home Medications    Prior to Admission medications   Medication Sig Start Date End Date Taking? Authorizing Provider  naproxen (NAPROSYN) 500 MG tablet Take 1 tablet (500 mg total) by mouth 2 (two) times daily as needed for mild pain, moderate pain or headache. 10/12/18   Tommie Sams, DO   Family History Family History  Problem Relation Age of Onset  . Healthy Mother   . Hypertension Father    Social History Social History   Tobacco Use  . Smoking status: Never Smoker  . Smokeless tobacco: Never  Used  Substance Use Topics  . Alcohol use: No  . Drug use: No    Allergies   Patient has no known allergies.   Review of Systems Review of Systems  Constitutional: Positive for chills and fever.  HENT: Positive for sore throat.   Musculoskeletal:       Body aches.  Neurological: Positive for headaches.   Physical Exam Triage Vital Signs ED Triage Vitals [10/12/18 1446]  Enc Vitals Group     BP 125/86     Pulse Rate 82     Resp 16     Temp 98.8 F (37.1 C)     Temp Source Oral     SpO2 99 %     Weight 215 lb (97.5 kg)     Height 5\' 4"  (1.626 m)     Head Circumference      Peak Flow      Pain Score 8     Pain Loc      Pain Edu?      Excl. in GC?    Updated Vital Signs BP 125/86 (BP Location: Left Arm)   Pulse 82   Temp 98.8 F (37.1 C) (Oral)   Resp 16   Ht 5\' 4"  (1.626 m)   Wt 97.5 kg   LMP 09/25/2018 (Approximate)   SpO2 99%   BMI 36.90 kg/m   Visual Acuity Right Eye Distance:   Left Eye Distance:   Bilateral Distance:    Right Eye Near:  Left Eye Near:    Bilateral Near:     Physical Exam Vitals signs and nursing note reviewed.  Constitutional:      General: She is not in acute distress.    Appearance: Normal appearance.  HENT:     Head: Normocephalic and atraumatic.     Nose: Nose normal.     Mouth/Throat:     Pharynx: Oropharynx is clear. No oropharyngeal exudate.  Eyes:     General:        Right eye: No discharge.        Left eye: No discharge.     Conjunctiva/sclera: Conjunctivae normal.  Cardiovascular:     Rate and Rhythm: Normal rate and regular rhythm.  Pulmonary:     Effort: Pulmonary effort is normal.     Breath sounds: Normal breath sounds.  Neurological:     Mental Status: She is alert.  Psychiatric:        Mood and Affect: Mood normal.        Behavior: Behavior normal.    UC Treatments / Results  Labs (all labs ordered are listed, but only abnormal results are displayed) Labs Reviewed  RAPID INFLUENZA A&B  ANTIGENS ([facility] ONLY)    EKG None  Radiology No results found.  Procedures Procedures (including critical care time)  Medications Ordered in UC Medications - No data to display  Initial Impression / Assessment and Plan / UC Course  I have reviewed the triage vital signs and the nursing notes.  Pertinent labs & imaging results that were available during my care of the patient were reviewed by me and considered in my medical decision making (see chart for details).    38 year old female presents with a viral respiratory infection.  Clinically does not appear to have influenza.  Flu test negative.  Treating body aches and headache with naproxen.  Supportive care.  Work note given.  Final Clinical Impressions(s) / UC Diagnoses   Final diagnoses:  Viral respiratory infection     Discharge Instructions     Rest. Fluids.  Medication as directed.  Take care  Dr. Adriana Simas    ED Prescriptions    Medication Sig Dispense Auth. Provider   naproxen (NAPROSYN) 500 MG tablet Take 1 tablet (500 mg total) by mouth 2 (two) times daily as needed for mild pain, moderate pain or headache. 30 tablet Tommie Sams, DO     Controlled Substance Prescriptions [place] Controlled Substance Registry consulted? Not Applicable   Tommie Sams, DO 10/12/18 1720

## 2018-10-12 NOTE — ED Triage Notes (Signed)
Patient in today c/o body aches, headache and scratchy throat since yesterday. Patient has felt feverish and has had chills. Patient has not tried any OTC medications.

## 2019-04-12 ENCOUNTER — Other Ambulatory Visit: Payer: Self-pay

## 2019-04-12 ENCOUNTER — Encounter: Payer: Self-pay | Admitting: Family Medicine

## 2019-04-12 ENCOUNTER — Ambulatory Visit
Admission: RE | Admit: 2019-04-12 | Discharge: 2019-04-12 | Disposition: A | Discharge status: Home / Self Care | Payer: BC Managed Care – PPO | Source: Ambulatory Visit | Attending: Family Medicine | Admitting: Family Medicine

## 2019-04-12 ENCOUNTER — Ambulatory Visit: Payer: BC Managed Care – PPO | Admitting: Family Medicine

## 2019-04-12 VITALS — BP 122/84 | HR 78 | Temp 97.3°F | Resp 14 | Ht 64.0 in | Wt 220.2 lb

## 2019-04-12 DIAGNOSIS — E669 Obesity, unspecified: Secondary | ICD-10-CM

## 2019-04-12 DIAGNOSIS — M545 Low back pain, unspecified: Secondary | ICD-10-CM

## 2019-04-12 DIAGNOSIS — M546 Pain in thoracic spine: Secondary | ICD-10-CM

## 2019-04-12 DIAGNOSIS — Z7689 Persons encountering health services in other specified circumstances: Secondary | ICD-10-CM | POA: Diagnosis not present

## 2019-04-12 DIAGNOSIS — R51 Headache: Secondary | ICD-10-CM | POA: Diagnosis not present

## 2019-04-12 DIAGNOSIS — Z6837 Body mass index (BMI) 37.0-37.9, adult: Secondary | ICD-10-CM

## 2019-04-12 DIAGNOSIS — R519 Headache, unspecified: Secondary | ICD-10-CM

## 2019-04-12 MED ORDER — SUMATRIPTAN SUCCINATE 25 MG PO TABS: 25.0000 mg | ORAL_TABLET | Freq: Once | ORAL | 1 refills | Status: DC

## 2019-04-12 MED ORDER — BACLOFEN 10 MG PO TABS: 10.0000 mg | ORAL_TABLET | Freq: Three times a day (TID) | ORAL | 0 refills | Status: DC | PRN
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
## 2019-04-12 NOTE — Patient Instructions (Addendum)
For imitres:  Imitrex.  Can use 25-100 mg by mouth once at the onset of headache, and if it doesn't resolve can take a second dose one hour later.  Max dose in 24 hours is 200 mg  Also try excedrine migraine first to see if it helps  Follow up with me in one month   Back Exercises These exercises help to make your trunk and back strong. They also help to keep the lower back flexible. Doing these exercises can help to prevent back pain or lessen existing pain.  If you have back pain, try to do these exercises 2-3 times each day or as told by your doctor.  As you get better, do the exercises once each day. Repeat the exercises more often as told by your doctor.  To stop back pain from coming back, do the exercises once each day, or as told by your doctor. Exercises Single knee to chest Do these steps 3-5 times in a row for each leg: 1. Lie on your back on a firm bed or the floor with your legs stretched out. 2. Bring one knee to your chest. 3. Grab your knee or thigh with both hands and hold them it in place. 4. Pull on your knee until you feel a gentle stretch in your lower back or buttocks. 5. Keep doing the stretch for 10-30 seconds. 6. Slowly let go of your leg and straighten it. Pelvic tilt Do these steps 5-10 times in a row: 1. Lie on your back on a firm bed or the floor with your legs stretched out. 2. Bend your knees so they point up to the ceiling. Your feet should be flat on the floor. 3. Tighten your lower belly (abdomen) muscles to press your lower back against the floor. This will make your tailbone point up to the ceiling instead of pointing down to your feet or the floor. 4. Stay in this position for 5-10 seconds while you gently tighten your muscles and breathe evenly. Cat-cow Do these steps until your lower back bends more easily: 1. Get on your hands and knees on a firm surface. Keep your hands under your shoulders, and keep your knees under your hips. You may put  padding under your knees. 2. Let your head hang down toward your chest. Tighten (contract) the muscles in your belly. Point your tailbone toward the floor so your lower back becomes rounded like the back of a cat. 3. Stay in this position for 5 seconds. 4. Slowly lift your head. Let the muscles of your belly relax. Point your tailbone up toward the ceiling so your back forms a sagging arch like the back of a cow. 5. Stay in this position for 5 seconds.  Press-ups Do these steps 5-10 times in a row: 1. Lie on your belly (face-down) on the floor. 2. Place your hands near your head, about shoulder-width apart. 3. While you keep your back relaxed and keep your hips on the floor, slowly straighten your arms to raise the top half of your body and lift your shoulders. Do not use your back muscles. You may change where you place your hands in order to make yourself more comfortable. 4. Stay in this position for 5 seconds. 5. Slowly return to lying flat on the floor.  Bridges Do these steps 10 times in a row: 1. Lie on your back on a firm surface. 2. Bend your knees so they point up to the ceiling. Your feet should be  flat on the floor. Your arms should be flat at your sides, next to your body. 3. Tighten your butt muscles and lift your butt off the floor until your waist is almost as high as your knees. If you do not feel the muscles working in your butt and the back of your thighs, slide your feet 1-2 inches farther away from your butt. 4. Stay in this position for 3-5 seconds. 5. Slowly lower your butt to the floor, and let your butt muscles relax. If this exercise is too easy, try doing it with your arms crossed over your chest. Belly crunches Do these steps 5-10 times in a row: 1. Lie on your back on a firm bed or the floor with your legs stretched out. 2. Bend your knees so they point up to the ceiling. Your feet should be flat on the floor. 3. Cross your arms over your chest. 4. Tip your  chin a little bit toward your chest but do not bend your neck. 5. Tighten your belly muscles and slowly raise your chest just enough to lift your shoulder blades a tiny bit off of the floor. Avoid raising your body higher than that, because it can put too much stress on your low back. 6. Slowly lower your chest and your head to the floor. Back lifts Do these steps 5-10 times in a row: 1. Lie on your belly (face-down) with your arms at your sides, and rest your forehead on the floor. 2. Tighten the muscles in your legs and your butt. 3. Slowly lift your chest off of the floor while you keep your hips on the floor. Keep the back of your head in line with the curve in your back. Look at the floor while you do this. 4. Stay in this position for 3-5 seconds. 5. Slowly lower your chest and your face to the floor. Contact a doctor if:  Your back pain gets a lot worse when you do an exercise.  Your back pain does not get better 2 hours after you exercise. If you have any of these problems, stop doing the exercises. Do not do them again unless your doctor says it is okay. Get help right away if:  You have sudden, very bad back pain. If this happens, stop doing the exercises. Do not do them again unless your doctor says it is okay. This information is not intended to replace advice given to you by your health care provider. Make sure you discuss any questions you have with your health care provider. Document Released: 08/28/2010 Document Revised: 04/20/2018 Document Reviewed: 04/20/2018 Elsevier Patient Education  2020 [employer]   Acute Back Pain, Adult Acute back pain is sudden and usually short-lived. It is often caused by an injury to the muscles and tissues in the back. The injury may result from:  A muscle or ligament getting overstretched or torn (strained). Ligaments are tissues that connect bones to each other. Lifting something improperly can cause a back strain.  Wear and tear  (degeneration) of the spinal disks. Spinal disks are circular tissue that provides cushioning between the bones of the spine (vertebrae).  Twisting motions, such as while playing sports or doing yard work.  A hit to the back.  Arthritis. You may have a physical exam, lab tests, and imaging tests to find the cause of your pain. Acute back pain usually goes away with rest and home care. Follow these instructions at home: Managing pain, stiffness, and swelling  Take  over-the-counter and prescription medicines only as told by your health care provider.  Your health care provider may recommend applying ice during the first 24-48 hours after your pain starts. To do this: ? Put ice in a plastic bag. ? Place a towel between your skin and the bag. ? Leave the ice on for 20 minutes, 2-3 times a day.  If directed, apply heat to the affected area as often as told by your health care provider. Use the heat source that your health care provider recommends, such as a moist heat pack or a heating pad. ? Place a towel between your skin and the heat source. ? Leave the heat on for 20-30 minutes. ? Remove the heat if your skin turns bright red. This is especially important if you are unable to feel pain, heat, or cold. You have a greater risk of getting burned. Activity   Do not stay in bed. Staying in bed for more than 1-2 days can delay your recovery.  Sit up and stand up straight. Avoid leaning forward when you sit, or hunching over when you stand. ? If you work at a desk, sit close to it so you do not need to lean over. Keep your chin tucked in. Keep your neck drawn back, and keep your elbows bent at a right angle. Your arms should look like the letter "L." ? Sit high and close to the steering wheel when you drive. Add lower back (lumbar) support to your car seat, if needed.  Take short walks on even surfaces as soon as you are able. Try to increase the length of time you walk each day.  Do not  sit, drive, or stand in one place for more than 30 minutes at a time. Sitting or standing for long periods of time can put stress on your back.  Do not drive or use heavy machinery while taking prescription pain medicine.  Use proper lifting techniques. When you bend and lift, use positions that put less stress on your back: ? [place] your knees. ? Keep the load close to your body. ? Avoid twisting.  Exercise regularly as told by your health care provider. Exercising helps your back heal faster and helps prevent back injuries by keeping muscles strong and flexible.  Work with a physical therapist to make a safe exercise program, as recommended by your health care provider. Do any exercises as told by your physical therapist. Lifestyle  Maintain a healthy weight. Extra weight puts stress on your back and makes it difficult to have good posture.  Avoid activities or situations that make you feel anxious or stressed. Stress and anxiety increase muscle tension and can make back pain worse. Learn ways to manage anxiety and stress, such as through exercise. General instructions  Sleep on a firm mattress in a comfortable position. Try lying on your side with your knees slightly bent. If you lie on your back, put a pillow under your knees.  Follow your treatment plan as told by your health care provider. This may include: ? Cognitive or behavioral therapy. ? Acupuncture or massage therapy. ? Meditation or yoga. Contact a health care provider if:  You have pain that is not relieved with rest or medicine.  You have increasing pain going down into your legs or buttocks.  Your pain does not improve after 2 weeks.  You have pain at night.  You lose weight without trying.  You have a fever or chills. Get help right away if:  You develop new bowel or bladder control problems.  You have unusual weakness or numbness in your arms or legs.  You develop nausea or vomiting.  You develop  abdominal pain.  You feel faint. Summary  Acute back pain is sudden and usually short-lived.  Use proper lifting techniques. When you bend and lift, use positions that put less stress on your back.  Take over-the-counter and prescription medicines and apply heat or ice as directed by your health care provider. This information is not intended to replace advice given to you by your health care provider. Make sure you discuss any questions you have with your health care provider. Document Released: 07/26/2005 Document Revised: 11/14/2018 Document Reviewed: 03/09/2017 Elsevier Patient Education  2020 Else[employer] Inc[employer]   General Headache Without Cause A headache is pain or discomfort that is felt around the head or neck area. There are many causes and types of headaches. In some cases, the cause may not be found. Follow these instructions at home: Watch your condition for any changes. Let your doctor know about them. Take these steps to help with your condition: Managing pain      Take over-the-counter and prescription medicines only as told by your doctor.  Lie down in a dark, quiet room when you have a headache.  If told, put ice on your head and neck area: ? Put ice in a plastic bag. ? Place a towel between your skin and the bag. ? Leave the ice on for 20 minutes, 2-3 times per day.  If told, put heat on the affected area. Use the heat source that your doctor recommends, such as a moist heat pack or a heating pad. ? Place a towel between your skin and the heat source. ? Leave the heat on for 20-30 minutes. ? Remove the heat if your skin turns bright red. This is very important if you are unable to feel pain, heat, or cold. You may have a greater risk of getting burned.  Keep lights dim if bright lights bother you or make your headaches worse. Eating and drinking  Eat meals on a regular schedule.  If you drink alcohol: ? Limit how much you use to:  0-1 drink a day for women.   0-2 drinks a day for men. ? Be aware of how much alcohol is in your drink. In the [place] one drink equals one 12 oz bottle of beer (355 mL), one 5 oz glass of wine (148 mL), or one 1 oz glass of hard liquor (44 mL).  Stop drinking caffeine, or reduce how much caffeine you drink. General instructions   Keep a journal to find out if certain things bring on headaches. For example, write down: ? What you eat and drink. ? How much sleep you get. ? Any change to your diet or medicines.  Get a massage or try other ways to relax.  Limit stress.  Sit up straight. Do not tighten (tense) your muscles.  Do not use any products that contain nicotine or tobacco. This includes cigarettes, e-cigarettes, and chewing tobacco. If you need help quitting, ask your doctor.  Exercise regularly as told by your doctor.  Get enough sleep. This often means 7-9 hours of sleep each night.  Keep all follow-up visits as told by your doctor. This is important. Contact a doctor if:  Your symptoms are not helped by medicine.  You have a headache that feels different than the other headaches.  You feel sick to your stomach (  nauseous) or you throw up (vomit).  You have a fever. Get help right away if:  Your headache gets very bad quickly.  Your headache gets worse after a lot of physical activity.  You keep throwing up.  You have a stiff neck.  You have trouble seeing.  You have trouble speaking.  You have pain in the eye or ear.  Your muscles are weak or you lose muscle control.  You lose your balance or have trouble walking.  You feel like you will pass out (faint) or you pass out.  You are mixed up (confused).  You have a seizure. Summary  A headache is pain or discomfort that is felt around the head or neck area.  There are many causes and types of headaches. In some cases, the cause may not be found.  Keep a journal to help find out what causes your headaches. Watch your  condition for any changes. Let your doctor know about them.  Contact a doctor if you have a headache that is different from usual, or if your headache is not helped by medicine.  Get help right away if your headache gets very bad, you throw up, you have trouble seeing, you lose your balance, or you have a seizure. This information is not intended to replace advice given to you by your health care provider. Make sure you discuss any questions you have with your health care provider. Document Released: 05/04/2008 Document Revised: 02/13/2018 Document Reviewed: 02/13/2018 Elsevier Patient Education  2020 [employer].

## 2019-04-13 ENCOUNTER — Encounter: Payer: Self-pay | Admitting: Family Medicine

## 2019-04-13 MED ORDER — IBUPROFEN 800 MG PO TABS: 800.0000 mg | ORAL_TABLET | Freq: Three times a day (TID) | ORAL | 1 refills | Status: DC | PRN

## 2019-04-19 ENCOUNTER — Encounter: Payer: Self-pay | Admitting: Family Medicine

## 2019-04-24 ENCOUNTER — Ambulatory Visit: Payer: BC Managed Care – PPO | Admitting: Physical Therapy

## 2019-04-24 NOTE — Telephone Encounter (Signed)
Lvm for pt call and schedule an appt

## 2019-04-27 ENCOUNTER — Other Ambulatory Visit: Payer: Self-pay

## 2019-04-27 ENCOUNTER — Encounter: Payer: Self-pay | Admitting: Family Medicine

## 2019-04-27 ENCOUNTER — Ambulatory Visit: Payer: BC Managed Care – PPO | Admitting: Family Medicine

## 2019-04-27 VITALS — BP 132/74 | HR 71 | Temp 98.1°F | Resp 14 | Ht 64.0 in

## 2019-04-27 DIAGNOSIS — R300 Dysuria: Secondary | ICD-10-CM

## 2019-04-27 DIAGNOSIS — R51 Headache: Secondary | ICD-10-CM

## 2019-04-27 DIAGNOSIS — M549 Dorsalgia, unspecified: Secondary | ICD-10-CM

## 2019-04-27 DIAGNOSIS — R519 Headache, unspecified: Secondary | ICD-10-CM

## 2019-04-27 LAB — POCT URINALYSIS DIPSTICK
Bilirubin, UA: NEGATIVE
Blood, UA: NEGATIVE
Glucose, UA: NEGATIVE
Ketones, UA: NEGATIVE
Nitrite, UA: NEGATIVE
Protein, UA: NEGATIVE
Spec Grav, UA: 1.025 (ref 1.010–1.025)
Urobilinogen, UA: 0.2 E.U./dL
pH, UA: 6 (ref 5.0–8.0)

## 2019-04-27 MED ORDER — FLUCONAZOLE 150 MG PO TABS: 150.0000 mg | ORAL_TABLET | ORAL | 0 refills | Status: DC | PRN

## 2019-04-27 MED ORDER — BUTALBITAL-APAP-CAFFEINE 50-325-40 MG PO TABS: 1.0000 | ORAL_TABLET | Freq: Three times a day (TID) | ORAL | 0 refills | Status: DC | PRN

## 2019-04-27 MED ORDER — CEPHALEXIN 500 MG PO CAPS: 500.0000 mg | ORAL_CAPSULE | Freq: Four times a day (QID) | ORAL | 0 refills | Status: AC

## 2019-04-27 MED ORDER — AMITRIPTYLINE HCL 25 MG PO TABS: 25.0000 mg | ORAL_TABLET | Freq: Every day | ORAL | 1 refills | Status: DC

## 2019-04-27 NOTE — Progress Notes (Signed)
Patient ID: Martha Grant, female    DOB: [birth year], 38 y.o.   MRN: [id number]  PCP: Delsa Grana, PA-C  Chief Complaint  Patient presents with  . Urinary Tract Infection    Back pain, burning with urination    Subjective:   Martha Grant is a 38 y.o. female, presents to clinic with CC of the following:  Very recently seen for months to years of back pain, pt called a few days later asking for abx because she thought she might have UTI.  She is here today to address that with now new urinary sx. Dysuria  This is a new problem. The current episode started in the past 7 days. The problem occurs every urination. The problem has been gradually worsening. The quality of the pain is described as burning. The pain is moderate. There has been no fever. She is not sexually active. There is no history of pyelonephritis. Associated symptoms include flank pain, frequency and urgency. Pertinent negatives include no chills, discharge, hematuria, hesitancy, nausea, possible pregnancy, sweats or vomiting. Associated symptoms comments: Vaginal itching +. She has tried increased fluids for the symptoms. The treatment provided mild relief. There is no history of kidney stones or recurrent UTIs.  Dysuria pain is moderate to severe, improving with pushing fluids Back pain 5/10 - getting worse, no help with muscle relaxers or NSAIDs.  She had to call out of work.  PT has contacted her and she is going to do her first PT next week. HA's 10/10 - worse and severe, no improvement at all with imitrex.  She tried it about 5-6 times with increasing dose from 25-50 mg and was hesitant to repeat the dose.      There are no active problems to display for this patient.     Current Outpatient Medications:  .  acetaminophen (TYLENOL) 650 MG CR tablet, Take 650 mg by mouth every 8 (eight) hours as needed for pain., Disp: , Rfl:  .  baclofen (LIORESAL) 10 MG tablet, Take 1 tablet (10 mg total) by mouth 3 (three) times  daily as needed for muscle spasms (muscle tightness)., Disp: 90 tablet, Rfl: 0 .  ibuprofen (ADVIL) 800 MG tablet, Take 1 tablet (800 mg total) by mouth every 8 (eight) hours as needed., Disp: 60 tablet, Rfl: 1 .  SUMAtriptan (IMITREX) 25 MG tablet, Take 1-4 tablets (25-100 mg total) by mouth once for 1 dose. May repeat in 2 hours if headache persists or recurs., Disp: 24 tablet, Rfl: 1   No Known Allergies   Family History  Problem Relation Age of Onset  . Healthy Mother   . Hypertension Father   . Healthy Brother   . Asthma Son   . Breast cancer Maternal Grandmother   . Hypertension Maternal Grandmother      Social History   Socioeconomic History  . Marital status: Significant Other    Spouse name: Not on file  . Number of children: 1  . Years of education: 68  . Highest education level: Associate degree: academic program  Occupational History  . Not on file  Social Needs  . Financial resource strain: Not hard at all  . Food insecurity    Worry: Never true    Inability: Never true  . Transportation needs    Medical: No    Non-medical: No  Tobacco Use  . Smoking status: Never Smoker  . Smokeless tobacco: Never Used  Substance and Sexual Activity  . Alcohol use:  No  . Drug use: No  . Sexual activity: Yes  Lifestyle  . Physical activity    Days per week: 0 days    Minutes per session: 0 min  . Stress: Not at all  Relationships  . Social Musician on phone: Never    Gets together: Once a week    Attends religious service: Never    Active member of club or organization: No    Attends meetings of clubs or organizations: Never    Relationship status: Not on file  . Intimate partner violence    Fear of current or ex partner: No    Emotionally abused: No    Physically abused: No    Forced sexual activity: No  Other Topics Concern  . Not on file  Social History Narrative  . Not on file    I personally reviewed active problem list, medication  list, allergies, family history, social history, health maintenance, notes from last encounter, lab results, imaging with the patient/caregiver today.  Reviewed all imaging reports with pt today for thoracic and lumbar spine  Review of Systems  Constitutional: Negative.  Negative for chills.  HENT: Negative.   Eyes: Negative.   Respiratory: Negative.   Cardiovascular: Negative.   Gastrointestinal: Negative.  Negative for nausea and vomiting.  Endocrine: Negative.   Genitourinary: Positive for dysuria, flank pain, frequency and urgency. Negative for hematuria and hesitancy.  Skin: Negative.   Allergic/Immunologic: Negative.   Neurological: Negative.   Hematological: Negative.   Psychiatric/Behavioral: Negative.   All other systems reviewed and are negative.      Objective:   Vitals:   04/27/19 1410  BP: 132/74  Pulse: 71  Resp: 14  Temp: 98.1 F (36.7 C)  SpO2: 99%  Height: 5\' 4"  (1.626 m)    Body mass index is 37.8 kg/m.  Physical Exam Vitals signs and nursing note reviewed.  Constitutional:      General: She is not in acute distress.    Appearance: Normal appearance. She is well-developed. She is obese. She is not ill-appearing, toxic-appearing or diaphoretic.     Interventions: Face mask in place.  HENT:     Head: Normocephalic and atraumatic.     Right Ear: External ear normal.     Left Ear: External ear normal.  Eyes:     General: Lids are normal. No scleral icterus.       Right eye: No discharge.        Left eye: No discharge.     Conjunctiva/sclera: Conjunctivae normal.  Neck:     Musculoskeletal: Normal range of motion and neck supple.     Trachea: Phonation normal. No tracheal deviation.  Cardiovascular:     Rate and Rhythm: Normal rate and regular rhythm.     Pulses: Normal pulses.          Radial pulses are 2+ on the right side and 2+ on the left side.       Posterior tibial pulses are 2+ on the right side and 2+ on the left side.     Heart  sounds: Normal heart sounds. No murmur. No friction rub. No gallop.   Pulmonary:     Effort: Pulmonary effort is normal. No respiratory distress.     Breath sounds: Normal breath sounds. No stridor. No wheezing, rhonchi or rales.  Chest:     Chest wall: No tenderness.  Abdominal:     General: Bowel sounds are normal. There is no  distension.     Palpations: Abdomen is soft.     Tenderness: There is no abdominal tenderness. There is right CVA tenderness (very mild). There is no left CVA tenderness, guarding or rebound.  Musculoskeletal: Normal range of motion.        General: No tenderness or deformity.     Cervical back: Normal. She exhibits normal range of motion, no tenderness, no bony tenderness, no swelling and no edema.     Thoracic back: Normal. She exhibits normal range of motion, no tenderness, no bony tenderness, no swelling, no edema, no deformity and no spasm.     Lumbar back: Normal. She exhibits normal range of motion, no tenderness, no bony tenderness, no swelling, no deformity and no spasm.     Right lower leg: No edema.     Left lower leg: No edema.  Lymphadenopathy:     Cervical: No cervical adenopathy.  Skin:    General: Skin is warm and dry.     Capillary Refill: Capillary refill takes less than 2 seconds.     Coloration: Skin is not jaundiced or pale.     Findings: No rash.  Neurological:     Mental Status: She is alert and oriented to person, place, and time.     Motor: No abnormal muscle tone.     Gait: Gait normal.  Psychiatric:        Speech: Speech normal.        Behavior: Behavior normal.      Results for orders placed or performed in visit on 04/27/19  POCT urinalysis dipstick  Result Value Ref Range   Color, UA yellow    Clarity, UA clear    Glucose, UA Negative Negative   Bilirubin, UA neg    Ketones, UA neg    Spec Grav, UA 1.025 1.010 - 1.025   Blood, UA neg    pH, UA 6.0 5.0 - 8.0   Protein, UA Negative Negative   Urobilinogen, UA 0.2 0.2  or 1.0 E.U./dL   Nitrite, UA neg    Leukocytes, UA Large (3+) (A) Negative   Appearance clear    Odor strong         Assessment & Plan:      ICD-10-CM   1. Dysuria  R30.0 Urine Culture    POCT urinalysis dipstick    cephALEXin (KEFLEX) 500 MG capsule    fluconazole (DIFLUCAN) 150 MG tablet   will tx for UTI and if any vaginal itching persists or worsens pt can try monistat OTC cream or vag suppositories or try diflucan, urine culture pending  2. Back pain, unspecified back location, unspecified back pain laterality, unspecified chronicity  M54.9 Urine Culture    POCT urinalysis dipstick    amitriptyline (ELAVIL) 25 MG tablet   worse, unlikely to be pyelo, back pain for months-years, dysuria x 1 week, going to PT, xrays done, stretch out f/up for another month or so  3. Intractable headache, unspecified chronicity pattern, unspecified headache type  R51 butalbital-acetaminophen-caffeine (FIORICET) 50-325-40 MG tablet    amitriptyline (ELAVIL) 25 MG tablet   unclear, no improvement with imitrex, pt still does not want specialist referrals.  D/c imitrex, try fioricet prn, elavil qpm for MSK pain and HA      also considered possible fibromyalgia?  Somatic pain?   May end up trying cymbalta?  But first try amitriptyline for chronic back pain and worsening episodic HA's.  Still no red flags for HA's Pt asked about back  and neck pain causing HA's - it is possible to be tension HA's the presentation just isn't typical.  I encouraged her to see neurology or HA specialist to assess and tx, she will wait for now and see if PT helps. Work note given.  Return in about 1 month (around 05/27/2019) for Annual Physical, change current appt to push back to do f/up and CPE 1-2 months.   Danelle BerryLeisa Keauna Brasel, PA-C 04/27/19 2:20 PM

## 2019-04-28 LAB — URINE CULTURE
MICRO NUMBER:: 898691
Result:: NO GROWTH
SPECIMEN QUALITY:: ADEQUATE

## 2019-04-30 ENCOUNTER — Ambulatory Visit: Payer: BC Managed Care – PPO | Attending: Family Medicine | Admitting: Physical Therapy

## 2019-04-30 ENCOUNTER — Encounter: Payer: Self-pay | Admitting: Physical Therapy

## 2019-04-30 ENCOUNTER — Other Ambulatory Visit: Payer: Self-pay

## 2019-04-30 DIAGNOSIS — R293 Abnormal posture: Secondary | ICD-10-CM | POA: Insufficient documentation

## 2019-04-30 DIAGNOSIS — M6281 Muscle weakness (generalized): Secondary | ICD-10-CM | POA: Insufficient documentation

## 2019-04-30 DIAGNOSIS — M546 Pain in thoracic spine: Secondary | ICD-10-CM | POA: Diagnosis present

## 2019-04-30 NOTE — Therapy (Signed)
[facility] [address] [place], [place], [address] Phone: [phone]   Fax:  [phone]  Physical Therapy Evaluation  Patient Details  Name: Martha Grant MRN: [id number] Date of Birth: February 14, 1981 Referring Provider (PT): Danelle Berry PA-C   Encounter Date: 04/30/2019  PT End of Session - 04/30/19 1307    Visit Number  1    Number of Visits  5    Date for PT Re-Evaluation  05/28/19    PT Start Time  1300    PT Stop Time  1350    PT Time Calculation (min)  50 min    Activity Tolerance  Patient tolerated treatment well    Behavior During Therapy  [facility] for tasks assessed/performed       Past Medical History:  Diagnosis Date  . Back pain   . Migraines   . 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 CESAREAN SECTION     x 2  . ULNA OSTEOTOMY Right 12/21/2013   Procedure: OPEN ULNAR SHORTENING OSTEOTOMY RIGHT ;  Surgeon: Nicki Reaper, MD;  Location: [facility];  Service: Orthopedics;  Laterality: Right;  . WRIST ARTHROSCOPY WITH FOVEAL TRIANGULAR FIBROCARTILAGE COMPLEX REPAIR Right 12/21/2013   Procedure: ARTHROSCOPY DEBRIDEMENT TRIANGULAR CARTILAGE COMPLEX RIGHT WRIST;  Surgeon: Nicki Reaper, MD;  Location: [facility];  Service: Orthopedics;  Laterality: Right;    There were no vitals filed for this visit.   [facility] PT Assessment - 04/30/19 1306      Assessment   Medical Diagnosis  Midline thoracic pain    Referring Provider (PT)  Danelle Berry PA-C       SUBJECTIVE  Chief complaint:  Patient states that the pain is mainly thoracic but depending on the day the pain may radiate up or down; with down being the majority. Patient states that she just woke up with the pain in her back. The only way the patient is able to sleep when the pain intensity is high is with a small bolster along the spine. Patient states that she is attending  PT because doctor suggested it and if it helps it helps, but she is not sure if it will. Patient has had PT before for neck/upper back; patient states it was okay.   Onset: March 2020  Pain: 4/10 Present, 4/10 Best, 20/10 Worst Aggravating factors: any movement Easing factors: thoracic extension 24 hour pain behavior: No specific pattern.  Recent neck trauma: No Prior history of neck injury or pain: No Pain quality: pain quality: aching and constant Radiating pain: Yes  Numbness/Tingling: No Follow-up appointment with MD: Yes Dominant hand: right Imaging: Yes    OBJECTIVE  Mental Status Patient is oriented to person, place and time.  Recent memory is intact.  Remote memory is intact.  Attention span and concentration are intact.  Expressive speech is intact.  Patient's fund of knowledge is within normal limits for educational level.  SENSATION: Grossly intact to light touch bilateral UE as determined by testing dermatomes C2-T2 Proprioception and hot/cold testing deferred on this date   MUSCULOSKELETAL: Tremor: None Bulk: Normal Tone: Normal  Posture Rounded shoulders, forward head, slumped   Palpation TTP at thoracolumbar jxn; hypertonicity noted in upper trapezius and paraspinals throughout   Strength R/L 5/5 Shoulder flexion (anterior deltoid/pec major/coracobrachialis, axillary n. (C5/6) and musculocutaneous n. (C5-7)) 5/5 Shoulder abduction (deltoid/supraspinatus, axillary/suprascapular n, C5) 5/5  Shoulder extension (posterior deltoid, lats, teres major, axillary/thoracodorsal n.) 5/5 Elbow flexion (biceps brachii, brachialis, brachioradialis, musculoskeletal n, C5/6) 5/5 Elbow extension (triceps, radial n, C7) 5/5 Hip flexion 5/5 Hip ER 5/5 Hip IR 5/5 Hip abduction 5/5 Hip adduction 5/5 Hip extension 5/5 Knee extension 5/5 Knee flexion 5/5 Ankle dorsiflexion   AROM R/L WNL Cervical Flexion WNL Cervical Extension WNL Cervical Lateral Flexion WNL  Cervical Rotation [facility] Thoracic Flexion painful when returning [facility] L>R, Thoracic Rotation painful both, R>L WFL R>L, Thoracic Lateral Flexion painful both, L>R *Indicates pain, overpressure performed unless otherwise indicated  Repeated Movements No centralization or peripheralization of symptoms with repeated cervical protraction and retraction.      Passive Accessory Intervertebral Motion (PAIVM) Patient denies reproduction of spine pain with CPA C4-T7, painful from T8-L1 with no apparent laterality. Generally hypomobile throughout   ASSESSMENT Patient is a 38 year old presenting to clinic with chief complaints of thoracic spine pain of high pain intensity. Upon examination, patient demonstrates deficits in posture, pain, postural strength as evidenced by worst pain 20/10, slumped posture with forward head and rounded shoulders, and inability to maintain erect posture against gravity for > 30 sec. Patient's responses on FOTO (mODI) outcome measures (58) indicate significant functional limitations/disability/distress. Patient's progress may be limited due to reported lack of confidence in physical therapy as helpful, time/financial constrainsts; however, patient's willingness to participate in the evaluation is advantageous. Patient will benefit from continued skilled therapeutic intervention to address deficits in posture, pain, postural strength in order to increase function and improve overall QOL.  Patient educated on prognosis, POC, and provided with HEP including: scap retraction and thoracic extension stretch. Patient articulated understanding and returned demonstration. Patient will benefit from further education in order to maximize compliance and understanding for long-term therapeutic gains.     Objective measurements completed on examination: See above findings.     PT Long Term Goals - 04/30/19 1752      PT LONG TERM GOAL #1   Title  Pt will be independent with HEP in order  to improve strength and decrease back pain in order to improve pain-free function at home and work.    Baseline  IE: provided    Time  4    Period  Weeks    Status  New    Target Date  05/28/19      PT LONG TERM GOAL #2   Title  Pt will demonstrate FOTO of at least 70 in order to demonstrate clinically significant reduction in disability related to spine pain.    Baseline  IE: 29    Time  4    Period  Weeks    Status  New    Target Date  05/28/19      PT LONG TERM GOAL #3   Title  Pt will decrease worst spine pain as reported on NPRS by at least 2 points in order to demonstrate clinically significant reduction in back pain.    Baseline  IE: 20/10    Time  4    Period  Weeks    Status  New    Target Date  05/28/19      PT LONG TERM GOAL #4   Title  Pt will increase postural strength/endurance to maintain erect/neutral posture for > 2 min in order to demonstrate improvement in strength and function.    Baseline  IE: <30 sec    Time  4    Period  Weeks    Status  New    Target Date  05/28/19             Plan - 04/30/19 1308    Clinical Impression Statement  Patient is a 38 year old presenting to clinic with chief complaints of thoracic spine pain of high pain intensity. Upon examination, patient demonstrates deficits in posture, pain, postural strength as evidenced by worst pain 20/10, slumped posture with forward head and rounded shoulders, and inability to maintain erect posture against gravity for > 30 sec. Patient's responses on FOTO (mODI) outcome measures (58) indicate significant functional limitations/disability/distress. Patient's progress may be limited due to reported lack of confidence in physical therapy as helpful, time/financial constrainsts; however, patient's willingness to participate in the evaluation is advantageous. Patient will benefit from continued skilled therapeutic intervention to address deficits in posture, pain, postural strength in order to increase  function and improve overall QOL.    Personal Factors and Comorbidities  Age;Sex;Behavior Pattern;Fitness;Profession;Time since onset of injury/illness/exacerbation;Comorbidity 1    Comorbidities  Migraines    Examination-Activity Limitations  Lift;Reach Overhead;Caring for Others;Carry    Examination-Participation Restrictions  Laundry;Shop;Cleaning;Yard Work;Meal Prep;Driving    Stability/Clinical Decision Making  Evolving/Moderate complexity    Clinical Decision Making  Moderate    Rehab Potential  Fair    PT Frequency  1x / week    PT Duration  4 weeks    PT Treatment/Interventions  ADLs/Self Care Home Management;Cryotherapy;Moist Heat;Therapeutic activities;Therapeutic exercise;Neuromuscular re-education;Patient/family education;Dry needling;Manual techniques;Taping;Joint Manipulations;Spinal Manipulations    PT Next Visit Plan  manual as needed, postural re-ed    PT Home Exercise Plan  scap retraction    Consulted and Agree with Plan of Care  Patient       Patient will benefit from skilled therapeutic intervention in order to improve the following deficits and impairments:  Increased muscle spasms, Obesity, Pain, Postural dysfunction, Improper body mechanics, Decreased strength, Decreased range of motion, Hypomobility  Visit Diagnosis: Abnormal posture  Pain in thoracic spine  Muscle weakness (generalized)     Problem List There are no active problems to display for this patient.  Sheria LangKatlin Dearies Meikle PT, DPT [phone] 04/30/2019, 5:54 PM  [facility] [address] [place], [place], [address] Phone: [phone]   Fax:  [phone]  Name: Martha Grant MRN: [id number] Date of Birth: 10-26-1980

## 2019-05-01 ENCOUNTER — Encounter: Payer: Self-pay | Admitting: Family Medicine

## 2019-05-02 ENCOUNTER — Encounter: Payer: Self-pay | Admitting: Family Medicine

## 2019-05-02 DIAGNOSIS — R519 Headache, unspecified: Secondary | ICD-10-CM

## 2019-05-02 NOTE — Telephone Encounter (Signed)
New pt with headaches that are frequent, severe, not improved with imitrex, now trying elavil. Will be doing FMLA paperwork for pt, will ask that she see the specialists since HA's so frequent and no hx of migraines. Referral put in, pt notified to bring in [facility] paperwork.  1. Nonintractable headache, unspecified chronicity pattern, unspecified headache type Currently tx with elavil, failed imitrex, doing HA journal, trying to improve sleep, decrease caffeine, going to PT for back pain and neck pain.   For further eval: - Ambulatory referral to Neurology

## 2019-05-07 NOTE — Telephone Encounter (Signed)
Pt informed and is going to try to bring paperwork in today to be filled out.

## 2019-05-10 ENCOUNTER — Ambulatory Visit: Payer: BC Managed Care – PPO | Attending: Family Medicine | Admitting: Physical Therapy

## 2019-05-11 ENCOUNTER — Telehealth: Payer: Self-pay

## 2019-05-11 NOTE — Telephone Encounter (Signed)
Copied from [place] [id number]. Topic: General - Inquiry >> May 11, 2019  1:00 PM Richardo Priest, [place] wrote: Reason for CRM: Patient called in stating she is needing to have FMLA paperwork finished today and ready for pick up as it is due Monday. Please advise.

## 2019-05-14 ENCOUNTER — Encounter: Payer: BC Managed Care – PPO | Admitting: Family Medicine

## 2019-05-14 DIAGNOSIS — R519 Headache, unspecified: Secondary | ICD-10-CM | POA: Insufficient documentation

## 2019-05-14 NOTE — Telephone Encounter (Signed)
Pt was notified on Friday that paperwork work was ready

## 2019-05-19 ENCOUNTER — Other Ambulatory Visit: Payer: Self-pay | Admitting: Family Medicine

## 2019-05-19 DIAGNOSIS — M549 Dorsalgia, unspecified: Secondary | ICD-10-CM

## 2019-05-19 DIAGNOSIS — R519 Headache, unspecified: Secondary | ICD-10-CM

## 2019-05-28 ENCOUNTER — Other Ambulatory Visit (HOSPITAL_COMMUNITY)
Admission: RE | Admit: 2019-05-28 | Discharge: 2019-05-28 | Disposition: A | Discharge status: Home / Self Care | Payer: BC Managed Care – PPO | Source: Ambulatory Visit | Attending: Family Medicine | Admitting: Family Medicine

## 2019-05-28 ENCOUNTER — Ambulatory Visit (INDEPENDENT_AMBULATORY_CARE_PROVIDER_SITE_OTHER): Payer: BC Managed Care – PPO | Admitting: Family Medicine

## 2019-05-28 ENCOUNTER — Encounter: Payer: Self-pay | Admitting: Family Medicine

## 2019-05-28 ENCOUNTER — Other Ambulatory Visit: Payer: Self-pay

## 2019-05-28 VITALS — BP 126/84 | HR 75 | Temp 97.9°F | Resp 14 | Ht 64.0 in | Wt 221.2 lb

## 2019-05-28 DIAGNOSIS — R519 Headache, unspecified: Secondary | ICD-10-CM

## 2019-05-28 DIAGNOSIS — Z6837 Body mass index (BMI) 37.0-37.9, adult: Secondary | ICD-10-CM

## 2019-05-28 DIAGNOSIS — N898 Other specified noninflammatory disorders of vagina: Secondary | ICD-10-CM

## 2019-05-28 DIAGNOSIS — Z124 Encounter for screening for malignant neoplasm of cervix: Secondary | ICD-10-CM | POA: Diagnosis present

## 2019-05-28 DIAGNOSIS — Z13220 Encounter for screening for lipoid disorders: Secondary | ICD-10-CM

## 2019-05-28 DIAGNOSIS — Z130 Encounter for screening for diseases of the blood and blood-forming organs and certain disorders involving the immune mechanism: Secondary | ICD-10-CM

## 2019-05-28 DIAGNOSIS — Z5181 Encounter for therapeutic drug level monitoring: Secondary | ICD-10-CM

## 2019-05-28 DIAGNOSIS — Z1329 Encounter for screening for other suspected endocrine disorder: Secondary | ICD-10-CM

## 2019-05-28 DIAGNOSIS — Z13228 Encounter for screening for other metabolic disorders: Secondary | ICD-10-CM

## 2019-05-28 DIAGNOSIS — E282 Polycystic ovarian syndrome: Secondary | ICD-10-CM

## 2019-05-28 DIAGNOSIS — M549 Dorsalgia, unspecified: Secondary | ICD-10-CM

## 2019-05-28 DIAGNOSIS — Z0000 Encounter for general adult medical examination without abnormal findings: Secondary | ICD-10-CM | POA: Insufficient documentation

## 2019-05-28 DIAGNOSIS — E669 Obesity, unspecified: Secondary | ICD-10-CM

## 2019-05-28 MED ORDER — PREMARIN 0.625 MG/GM VA CREA: 1.0000 | TOPICAL_CREAM | Freq: Every day | VAGINAL | 12 refills | Status: DC

## 2019-05-28 NOTE — Patient Instructions (Addendum)
Preventive Care 21-36 Years Old, Female °Preventive care refers to visits with your health care provider and lifestyle choices that can promote health and wellness. This includes: °· A yearly physical exam. This may also be called an annual well check. °· Regular dental visits and eye exams. °· Immunizations. °· Screening for certain conditions. °· Healthy lifestyle choices, such as eating a healthy diet, getting regular exercise, not using drugs or products that contain nicotine and tobacco, and limiting alcohol use. °What can I expect for my preventive care visit? °Physical exam °Your health care provider will check your: °· Height and weight. This may be used to calculate body mass index (BMI), which tells if you are at a healthy weight. °· Heart rate and blood pressure. °· Skin for abnormal spots. °Counseling °Your health care provider may ask you questions about your: °· Alcohol, tobacco, and drug use. °· Emotional well-being. °· Home and relationship well-being. °· Sexual activity. °· Eating habits. °· Work and work environment. °· Method of birth control. °· Menstrual cycle. °· Pregnancy history. °What immunizations do I need? ° °Influenza (flu) vaccine °· This is recommended every year. °Tetanus, diphtheria, and pertussis (Tdap) vaccine °· You may need a Td booster every 10 years. °Varicella (chickenpox) vaccine °· You may need this if you have not been vaccinated. °Human papillomavirus (HPV) vaccine °· If recommended by your health care provider, you may need three doses over 6 months. °Measles, mumps, and rubella (MMR) vaccine °· You may need at least one dose of MMR. You may also need a second dose. °Meningococcal conjugate (MenACWY) vaccine °· One dose is recommended if you are age 19-21 years and a first-year college student living in a residence hall, or if you have one of several medical conditions. You may also need additional booster doses. °Pneumococcal conjugate (PCV13) vaccine °· You may need  this if you have certain conditions and were not previously vaccinated. °Pneumococcal polysaccharide (PPSV23) vaccine °· You may need one or two doses if you smoke cigarettes or if you have certain conditions. °Hepatitis A vaccine °· You may need this if you have certain conditions or if you travel or work in places where you may be exposed to hepatitis A. °Hepatitis B vaccine °· You may need this if you have certain conditions or if you travel or work in places where you may be exposed to hepatitis B. °Haemophilus influenzae type b (Hib) vaccine °· You may need this if you have certain conditions. °You may receive vaccines as individual doses or as more than one vaccine together in one shot (combination vaccines). Talk with your health care provider about the risks and benefits of combination vaccines. °What tests do I need? ° °Blood tests °· Lipid and cholesterol levels. These may be checked every 5 years starting at age 20. °· Hepatitis C test. °· Hepatitis B test. °Screening °· Diabetes screening. This is done by checking your blood sugar (glucose) after you have not eaten for a while (fasting). °· Sexually transmitted disease (STD) testing. °· BRCA-related cancer screening. This may be done if you have a family history of breast, ovarian, tubal, or peritoneal cancers. °· Pelvic exam and Pap test. This may be done every 3 years starting at age 21. Starting at age 30, this may be done every 5 years if you have a Pap test in combination with an HPV test. °Talk with your health care provider about your test results, treatment options, and if necessary, the need for more tests. °  tests. Follow these instructions at home: Eating and drinking   Eat a diet that includes fresh fruits and vegetables, whole grains, lean protein, and low-fat dairy.  Take vitamin and mineral supplements as recommended by your health care provider.  Do not drink alcohol if: ? Your health care provider tells you not to drink. ? You are  pregnant, may be pregnant, or are planning to become pregnant.  If you drink alcohol: ? Limit how much you have to 0-1 drink a day. ? Be aware of how much alcohol is in your drink. In the [place] one drink equals one 12 oz bottle of beer (355 mL), one 5 oz glass of wine (148 mL), or one 1 oz glass of hard liquor (44 mL). Lifestyle  Take daily care of your teeth and gums.  Stay active. Exercise for at least 30 minutes on 5 or more days each week.  Do not use any products that contain nicotine or tobacco, such as cigarettes, e-cigarettes, and chewing tobacco. If you need help quitting, ask your health care provider.  If you are sexually active, practice safe sex. Use a condom or other form of birth control (contraception) in order to prevent pregnancy and STIs (sexually transmitted infections). If you plan to become pregnant, see your health care provider for a preconception visit. What's next?  Visit your health care provider once a year for a well check visit.  Ask your health care provider how often you should have your eyes and teeth checked.  Stay up to date on all vaccines. This information is not intended to replace advice given to you by your health care provider. Make sure you discuss any questions you have with your health care provider. Document Released: 09/21/2001 Document Revised: 04/06/2018 Document Reviewed: 04/06/2018 Elsevier Patient Education  2020 [place] (AHA) Exercise Recommendation  Being physically active is important to prevent heart disease and stroke, the nations No. 1and No. 5killers. To improve overall cardiovascular health, we suggest at least 150 minutes per week of moderate exercise or 75 minutes per week of vigorous exercise (or a combination of moderate and vigorous activity). Thirty minutes a day, five times a week is an easy goal to remember. You will also experience benefits even if you divide your time into two or three  segments of 10 to 15 minutes per day.  For people who would benefit from lowering their blood pressure or cholesterol, we recommend 40 minutes of aerobic exercise of moderate to vigorous intensity three to four times a week to lower the risk for heart attack and stroke.  Physical activity is anything that makes you move your body and burn calories.  This includes things like climbing stairs or playing sports. Aerobic exercises benefit your heart, and include walking, jogging, swimming or biking. Strength and stretching exercises are best for overall stamina and flexibility.  The simplest, positive change you can make to effectively improve your heart health is to start walking. It's enjoyable, free, easy, social and great exercise. A walking program is flexible and boasts high success rates because people can stick with it. It's easy for walking to become a regular and satisfying part of life.   For Overall Cardiovascular Health:  At least 30 minutes of moderate-intensity aerobic activity at least 5 days per week for a total of 150  OR   At least 25 minutes of vigorous aerobic activity at least 3 days per week for a total of 75 minutes; or  a combination of moderate- and vigorous-intensity aerobic activity  AND   Moderate- to high-intensity muscle-strengthening activity at least 2 days per week for additional health benefits.  For Lowering Blood Pressure and Cholesterol  An average 40 minutes of moderate- to vigorous-intensity aerobic activity 3 or 4 times per week  What if I cant make it to the time goal? Something is always better than nothing! And everyone has to start somewhere. Even if you've been sedentary for years, today is the day you can begin to make healthy changes in your life. If you don't think you'll make it for 30 or 40 minutes, set a reachable goal for today. You can work up toward your overall goal by increasing your time as you get stronger. Don't let all-or-nothing  thinking rob you of doing what you can every day.  Source:http://www.heart.org    Diet for Polycystic Ovary Syndrome Polycystic ovary syndrome (PCOS) is a disorder of the chemicals (hormones) that regulate a woman's reproductive system, including monthly periods (menstruation). The condition causes important hormones to be out of balance. PCOS can:  Stop your periods or make them irregular.  Cause cysts to develop on your ovaries.  Make it difficult to get pregnant.  Stop your body from responding to the effects of insulin (insulin resistance). Insulin resistance can lead to obesity and diabetes. Changing what you eat can help you manage PCOS and improve your health. Following a balanced diet can help you lose weight and improve the way that your body uses insulin. What are tips for following this plan?  Follow a balanced diet for meals and snacks. Eat breakfast, lunch, dinner, and one or two snacks every day.  Include protein in each meal and snack.  Choose whole grains instead of products that are made with refined flour.  Eat a variety of foods.  Exercise regularly as told by your health care provider. Aim to do 30 or more minutes of exercise on most days of the week.  If you are overweight or obese: ? Pay attention to how many calories you eat. Cutting down on calories can help you lose weight. ? Work with your health care provider or a diet and nutrition specialist (dietitian) to figure out how many calories you need each day. What foods can I eat?  Fruits Include a variety of colors and types. All fruits are helpful for PCOS. Vegetables Include a variety of colors and types. All vegetables are helpful for PCOS. Grains Whole grains, such as whole wheat. Whole-grain breads, crackers, cereals, and pasta. Unsweetened oatmeal, bulgur, barley, quinoa, and brown rice. Tortillas made from corn or whole-wheat flour. Meats and other proteins Low-fat (lean) proteins, such as fish,  chicken, beans, eggs, and tofu. Dairy Low-fat dairy products, such as skim milk, cheese sticks, and yogurt. Beverages Low-fat or fat-free drinks, such as water, low-fat milk, sugar-free drinks, and small amounts of 100% fruit juice. Seasonings and condiments Ketchup. Mustard. Barbecue sauce. Relish. Low-fat or fat-free mayonnaise. Fats and oils Olive oil or canola oil. Walnuts and almonds. The items listed above may not be a complete list of recommended foods and beverages. Contact a dietitian for more options. What foods are not recommended? Foods that are high in calories or fat. Fried foods. Sweets. Products that are made from refined white flour, including white bread, pastries, white rice, and pasta. The items listed above may not be a complete list of foods and beverages to avoid. Contact a dietitian for more information. Summary  PCOS is  a hormonal imbalance that affects a woman's reproductive system.  You can help to manage your PCOS by exercising regularly and eating a healthy, varied diet of vegetables, fruit, whole grains, low-fat (lean) protein, and low-fat dairy products.  Changing what you eat can improve the way that your body uses insulin, help your hormones reach normal levels, and help you lose weight. This information is not intended to replace advice given to you by your health care provider. Make sure you discuss any questions you have with your health care provider. Document Released: 11/17/2015 Document Revised: 11/15/2018 Document Reviewed: 05/30/2017 Elsevier Patient Education  [place].   Polycystic Ovarian Syndrome  Polycystic ovarian syndrome (PCOS) is a common hormonal disorder among women of reproductive age. In most women with PCOS, many small fluid-filled sacs (cysts) grow on the ovaries, and the cysts are not part of a normal menstrual cycle. PCOS can cause problems with your menstrual periods and make it difficult to get pregnant. It can also  cause an increased risk of miscarriage with pregnancy. If it is not treated, PCOS can lead to serious health problems, such as diabetes and heart disease. What are the causes? The cause of PCOS is not known, but it may be the result of a combination of certain factors, such as:  Irregular menstrual cycle.  High levels of certain hormones (androgens).  Problems with the hormone that helps to control blood sugar (insulin resistance).  Certain genes. What increases the risk? This condition is more likely to develop in women who have a family history of PCOS. What are the signs or symptoms? Symptoms of PCOS may include:  Multiple ovarian cysts.  Infrequent periods or no periods.  Periods that are too frequent or too heavy.  Unpredictable periods.  Inability to get pregnant (infertility) because of not ovulating.  Increased growth of hair on the face, chest, stomach, back, thumbs, thighs, or toes.  Acne or oily skin. Acne may develop during adulthood, and it may not respond to treatment.  Pelvic pain.  Weight gain or obesity.  Patches of thickened and dark brown or black skin on the neck, arms, breasts, or thighs (acanthosis nigricans).  Excess hair growth on the face, chest, abdomen, or upper thighs (hirsutism). How is this diagnosed? This condition is diagnosed based on:  Your medical history.  A physical exam, including a pelvic exam. Your health care provider may look for areas of increased hair growth on your skin.  Tests, such as: ? Ultrasound. This may be used to examine the ovaries and the lining of the uterus (endometrium) for cysts. ? Blood tests. These may be used to check levels of sugar (glucose), female hormone (testosterone), and female hormones (estrogen and progesterone) in your blood. How is this treated? There is no cure for PCOS, but treatment can help to manage symptoms and prevent more health problems from developing. Treatment varies depending  on:  Your symptoms.  Whether you want to have a baby or whether you need birth control (contraception). Treatment may include nutrition and lifestyle changes along with:  Progesterone hormone to start a menstrual period.  Birth control pills to help you have regular menstrual periods.  Medicines to make you ovulate, if you want to get pregnant.  Medicine to reduce excessive hair growth.  Surgery, in severe cases. This may involve making small holes in one or both of your ovaries. This decreases the amount of testosterone that your body produces. Follow these instructions at home:  Take over-the-counter  and prescription medicines only as told by your health care provider.  Follow a healthy meal plan. This can help you reduce the effects of PCOS. ? Eat a healthy diet that includes lean proteins, complex carbohydrates, fresh fruits and vegetables, low-fat dairy products, and healthy fats. Make sure to eat enough fiber.  If you are overweight, lose weight as told by your health care provider. ? Losing 10% of your body weight may improve symptoms. ? Your health care provider can determine how much weight loss is best for you and can help you lose weight safely.  Keep all follow-up visits as told by your health care provider. This is important. Contact a health care provider if:  Your symptoms do not get better with medicine.  You develop new symptoms. This information is not intended to replace advice given to you by your health care provider. Make sure you discuss any questions you have with your health care provider. Document Released: 11/19/2004 Document Revised: 07/08/2017 Document Reviewed: 01/11/2016 Elsevier Patient Education  2020 [employer].

## 2019-05-28 NOTE — Progress Notes (Signed)
Patient: Martha Grant, Female    DOB: 12-15-1980, 38 y.o.   MRN: [id number] Delsa Grana, PA-C Visit Date: 05/28/2019  Today's Provider: Delsa Grana, PA-C   Chief Complaint  Patient presents with  . Annual Exam   Subjective:   Annual physical exam:  Martha Grant is a 38 y.o. female who presents today for health maintenance and annual & complete physical exam.   Exercise/Activity:  No formal exercise right now  Diet/nutrition:  Was doing good, but not working on it right now  One abnormal PAP in the past - the repeat was normal   She reports she is sleeping fairly well - night shift  Chronic back and neck pain- getting better, only did one PT session HA's - better with some med changes see [facility]  USPSTF grade A and B recommendations - reviewed and addressed today  Depression:  Phq 9 completed today by patient, was reviewed by me with patient in the room, score is  Negative Doing okay, has a lot going on, tired PHQ 2/9 Scores 05/28/2019 04/27/2019 04/12/2019  PHQ - 2 Score 0 0 0  PHQ- 9 Score 0 0 0   Depression screen [facility] 2/9 05/28/2019 04/27/2019 04/12/2019  Decreased Interest 0 0 0  Down, Depressed, Hopeless 0 0 0  PHQ - 2 Score 0 0 0  Altered sleeping 0 0 0  Tired, decreased energy 0 0 0  Change in appetite 0 0 0  Feeling bad or failure about yourself  0 0 0  Trouble concentrating 0 0 0  Moving slowly or fidgety/restless 0 0 0  Suicidal thoughts 0 0 0  PHQ-9 Score 0 0 0  Difficult doing work/chores Not difficult at all Not difficult at all -    Hep C Screening: not indicated STD testing and prevention (HIV/chl/gon/syphilis): HIV done, low risk, no other STDs requested Intimate partner violence:  Feels safe Sexual History/Pain during Intercourse:  none Significant Other- same partner for years Menstrual History/LMP/Abnormal Bleeding:  Patient's last menstrual period was 05/17/2019.  Past Dx of PCOS - skips 2-6 months, menses 5 days  [place]  [facility] - has pelvic US  Incontinence Symptoms:  none  Breast cancer:  No results found for: HMMAMMO  BRCA gene screening: unknown Cervical cancer screening:  Due today Family hx of cancers - breast, ovarian, uterine, colon Maternal grandma with Breast CA  Skin cancer:  Skin CA hx NO Last skin survey:  Colorectal cancer:   colonoscopy is not due  Lung cancer:   Patient does not qualify.   Social History   Tobacco Use  . Smoking status: Never Smoker  . Smokeless tobacco: Never Used  Substance Use Topics  . Alcohol use: No    Alcohol screening:   Office Visit from 05/28/2019 in [facility]  AUDIT-C Score  0     Blood pressure/Hypertension: BP Readings from Last 3 Encounters:  05/28/19 126/84  04/27/19 132/74  04/12/19 122/84   Weight/Obesity: Wt Readings from Last 3 Encounters:  05/28/19 221 lb 3.2 oz (100.3 kg)  04/12/19 220 lb 3.2 oz (99.9 kg)  10/12/18 215 lb (97.5 kg)   BMI Readings from Last 3 Encounters:  05/28/19 37.97 kg/m  04/27/19 37.80 kg/m  04/12/19 37.80 kg/m    Lipids:  No results found for: CHOL No results found for: HDL No results found for: LDLCALC No results found for: TRIG No results found for: CHOLHDL No results found for: LDLDIRECT Based on  the results of lipid panel his/her cardiovascular risk factor ( using [facility] )  in the next 10 years is: The ASCVD Risk score Mikey Bussing DC Jr., et al., 2013) failed to calculate for the following reasons:   The 2013 ASCVD risk score is only valid for ages 5 to 12 Glucose:  No results found for: GLUCOSE, GLUCAP  Advanced Care Planning:  A voluntary discussion about advance care planning including the explanation and discussion of advance directives.   Discussed health care proxy and Living will, and the patient was able to identify a health care proxy as he mom - Martha Grant.   Patient does not have a living will at present time.   Social History      She  reports  that she has never smoked. She has never used smokeless tobacco. She reports that she does not drink alcohol or use drugs.       Social History   Socioeconomic History  . Marital status: Significant Other    Spouse name: Not on file  . Number of children: 1  . Years of education: 65  . Highest education level: Associate degree: academic program  Occupational History  . Not on file  Social Needs  . Financial resource strain: Not hard at all  . Food insecurity    Worry: Never true    Inability: Never true  . Transportation needs    Medical: No    Non-medical: No  Tobacco Use  . Smoking status: Never Smoker  . Smokeless tobacco: Never Used  Substance and Sexual Activity  . Alcohol use: No  . Drug use: No  . Sexual activity: Yes  Lifestyle  . Physical activity    Days per week: 0 days    Minutes per session: 0 min  . Stress: Not at all  Relationships  . Social Herbalist on phone: Never    Gets together: Once a week    Attends religious service: Never    Active member of club or organization: No    Attends meetings of clubs or organizations: Never    Relationship status: Not on file  Other Topics Concern  . Not on file  Social History Narrative  . Not on file    Family History        Family Status  Relation Name Status  . Mother  Alive  . Father  Alive  . Brother  Alive  . Son  Alive  . MGM  Alive  . MGF  Alive  . PGM  Alive  . PGF  Deceased        Her family history includes Asthma in her son; Breast cancer in her maternal grandmother; Healthy in her brother and mother; Hypertension in her father and maternal grandmother.       Family History  Problem Relation Age of Onset  . Healthy Mother   . Hypertension Father   . Healthy Brother   . Asthma Son   . Breast cancer Maternal Grandmother   . Hypertension Maternal Grandmother     Patient Active Problem List   Diagnosis Date Noted  . Headache disorder 05/14/2019    Past Surgical History:   Procedure Laterality Date  . CESAREAN SECTION     x 2  . ULNA OSTEOTOMY Right 12/21/2013   Procedure: OPEN ULNAR SHORTENING OSTEOTOMY RIGHT ;  Surgeon: Wynonia Sours, MD;  Location: [place];  Service: Orthopedics;  Laterality: Right;  .  WRIST ARTHROSCOPY WITH FOVEAL TRIANGULAR FIBROCARTILAGE COMPLEX REPAIR Right 12/21/2013   Procedure: ARTHROSCOPY DEBRIDEMENT TRIANGULAR CARTILAGE COMPLEX RIGHT WRIST;  Surgeon: Wynonia Sours, MD;  Location: [place];  Service: Orthopedics;  Laterality: Right;     Current Outpatient Medications:  .  acetaminophen (TYLENOL) 650 MG CR tablet, Take 650 mg by mouth every 8 (eight) hours as needed for pain., Disp: , Rfl:  .  butalbital-acetaminophen-caffeine (FIORICET) 50-325-40 MG tablet, Take 1 tablet by mouth every 4 (four) hours as needed., Disp: , Rfl:  .  ibuprofen (ADVIL) 200 MG tablet, Take 200 mg by mouth 3 (three) times daily as needed., Disp: , Rfl:  .  nortriptyline (PAMELOR) 25 MG capsule, , Disp: , Rfl:  .  SUMAtriptan (IMITREX) 25 MG tablet, , Disp: , Rfl:  .  baclofen (LIORESAL) 10 MG tablet, Take 1 tablet (10 mg total) by mouth 3 (three) times daily as needed for muscle spasms (muscle tightness). (Patient not taking: Reported on 05/28/2019), Disp: 90 tablet, Rfl: 0 .  conjugated estrogens (PREMARIN) vaginal cream, Place 1 Applicatorful vaginally daily., Disp: 42.5 g, Rfl: 12 .  fluconazole (DIFLUCAN) 150 MG tablet, Take 1 tablet (150 mg total) by mouth every 3 (three) days as needed (for vaginal itching/yeast infection sx). (Patient not taking: Reported on 05/28/2019), Disp: 2 tablet, Rfl: 0 .  nortriptyline (PAMELOR) 25 MG capsule, Take by mouth., Disp: , Rfl:   No Known Allergies  Patient Care Team: Delsa Grana, PA-C as PCP - General (Family Medicine)  I personally reviewed active problem list, medication list, allergies, family history, social history, health maintenance, notes from last encounter, lab results,  imaging with the patient/caregiver today.  Review of Systems  Constitutional: Negative.  Negative for activity change, appetite change, fatigue and unexpected weight change.  HENT: Negative.   Eyes: Negative.   Respiratory: Negative.  Negative for shortness of breath.   Cardiovascular: Negative.  Negative for chest pain, palpitations and leg swelling.  Gastrointestinal: Negative.  Negative for abdominal pain and blood in stool.  Endocrine: Negative.   Genitourinary: Positive for menstrual problem. Negative for vaginal bleeding, vaginal discharge and vaginal pain.  Musculoskeletal: Negative.  Negative for arthralgias, gait problem, joint swelling and myalgias.  Skin: Negative.  Negative for color change, pallor and rash.  Allergic/Immunologic: Negative.   Neurological: Negative.  Negative for syncope and weakness.  Hematological: Negative.   Psychiatric/Behavioral: Negative.  Negative for confusion, dysphoric mood, self-injury and suicidal ideas. The patient is not nervous/anxious.            Objective:   Vitals:  Vitals:   05/28/19 1050  BP: 126/84  Pulse: 75  Resp: 14  Temp: 97.9 F (36.6 C)  SpO2: 94%  Weight: 221 lb 3.2 oz (100.3 kg)  Height: 5' 4"  (1.626 m)    Body mass index is 37.97 kg/m.  Physical Exam Vitals signs and nursing note reviewed. Exam conducted with a chaperone present.  Constitutional:      General: She is not in acute distress.    Appearance: Normal appearance. She is well-developed. She is obese. She is not ill-appearing, toxic-appearing or diaphoretic.     Interventions: Face mask in place.  HENT:     Head: Normocephalic and atraumatic.     Right Ear: External ear normal.     Left Ear: External ear normal.     Nose: Nose normal.     Mouth/Throat:     Mouth: Mucous membranes are moist.  Pharynx: Oropharynx is clear.  Eyes:     General: Lids are normal. No scleral icterus.       Right eye: No discharge.        Left eye: No discharge.      Conjunctiva/sclera: Conjunctivae normal.  Neck:     Musculoskeletal: Normal range of motion and neck supple.     Trachea: Phonation normal. No tracheal deviation.  Cardiovascular:     Rate and Rhythm: Normal rate and regular rhythm.     Pulses: Normal pulses.          Radial pulses are 2+ on the right side and 2+ on the left side.       Posterior tibial pulses are 2+ on the right side and 2+ on the left side.     Heart sounds: Normal heart sounds. No murmur. No friction rub. No gallop.   Pulmonary:     Effort: Pulmonary effort is normal. No respiratory distress.     Breath sounds: Normal breath sounds. No stridor. No wheezing, rhonchi or rales.  Chest:     Chest wall: No tenderness.  Abdominal:     General: Bowel sounds are normal. There is no distension.     Palpations: Abdomen is soft.     Tenderness: There is no abdominal tenderness. There is no guarding or rebound.     Hernia: There is no hernia in the left inguinal area or right inguinal area.  Genitourinary:    Vagina: Normal.     Cervix: Friability present. No cervical motion tenderness, lesion, erythema, cervical bleeding or eversion.     Uterus: Normal.      Adnexa: Right adnexa normal and left adnexa normal.       Right: No mass, tenderness or fullness.         Left: No mass, tenderness or fullness.       Rectum: Normal. Normal anal tone.  Musculoskeletal: Normal range of motion.        General: No deformity.     Right lower leg: No edema.     Left lower leg: No edema.  Lymphadenopathy:     Cervical: No cervical adenopathy.     Lower Body: No right inguinal adenopathy. No left inguinal adenopathy.  Skin:    General: Skin is warm and dry.     Capillary Refill: Capillary refill takes less than 2 seconds.     Coloration: Skin is not jaundiced or pale.     Findings: No rash.  Neurological:     Mental Status: She is alert and oriented to person, place, and time.     Motor: No abnormal muscle tone.     Gait: Gait  normal.  Psychiatric:        Mood and Affect: Mood normal.        Speech: Speech normal.        Behavior: Behavior normal.        Fall Risk: Fall Risk  05/28/2019 04/27/2019 04/12/2019  Falls in the past year? 0 0 0  Number falls in past yr: 0 0 0  Injury with Fall? 0 0 0    Functional Status Survey: Is the patient deaf or have difficulty hearing?: No Does the patient have difficulty seeing, even when wearing glasses/contacts?: No Does the patient have difficulty concentrating, remembering, or making decisions?: No Does the patient have difficulty walking or climbing stairs?: No Does the patient have difficulty dressing or bathing?: No Does the patient have difficulty doing  errands alone such as visiting a doctor's office or shopping?: No   Assessment & Plan:    CPE completed today  . USPSTF grade A and B recommendations reviewed with patient; age-appropriate recommendations, preventive care, screening tests, etc discussed and encouraged; healthy living encouraged; see AVS for patient education given to patient  . Discussed importance of 150 minutes of physical activity weekly, AHA exercise recommendations given to pt in AVS/handout  . Discussed importance of healthy diet:  eating lean meats and proteins, avoiding trans fats and saturated fats, avoid simple sugars and excessive carbs in diet, eat 6 servings of fruit/vegetables daily and drink plenty of water and avoid sweet beverages.    . Recommended pt to do annual eye exam and routine dental exams/cleanings  . Depression, alcohol, fall screening completed as documented above and per flowsheets  . Reviewed Health Maintenance: Health Maintenance  Topic Date Due  . PAP SMEAR-Modifier  03/27/2004  . INFLUENZA VACCINE  06/04/2019 (Originally 03/10/2019)  . HIV Screening  05/27/2020 (Originally 03/27/1998)  . TETANUS/TDAP  08/09/2020    . Immunizations: Immunization History  Administered Date(s) Administered  . Varicella  12/05/2010      ICD-10-CM   1. Adult general medical exam  Z00.00 CBC with Differential/Platelet    COMPLETE METABOLIC PANEL WITH GFR    Lipid panel    Hemoglobin A1c    TSH    Cytology - PAP  2. Encounter for medication monitoring  Z51.81    on multiple meds now for back pain and HA's  3. Screening for lipoid disorders  Z13.220 Lipid panel  4. Screening for endocrine, metabolic and immunity disorder  Z13.29 CBC with Differential/Platelet   M08.022 COMPLETE METABOLIC PANEL WITH GFR   Z13.0 Lipid panel    Hemoglobin A1c    TSH  5. Screening for malignant neoplasm of cervix  Z12.4 Cytology - PAP   healthy cervix, slight bleeding with PAP  6. Class 2 obesity with body mass index (BMI) of 37.0 to 37.9 in adult, unspecified obesity type, unspecified whether serious comorbidity present  E66.9 Lipid panel   Z68.37 Hemoglobin A1c    TSH   screen TSH, cholesterol  7. Headache disorder  R51.9    improving - per Neuro  8. Vaginal dryness  N89.8 conjugated estrogens (PREMARIN) vaginal cream   trial of premarin, no contraindications - recommended OBGYN referral for vaginal issues and PCOS- pt was going to read up and lmk what she wants to do  9. PCOS (polycystic ovarian syndrome)  E28.2    discussed condition, screening labs, discussed tx and handouts given.    10. Back pain, unspecified back location, unspecified back pain laterality, unspecified chronicity  M54.9    improving, only went to PT once, HA meds likely helping with MSK pain       Delsa Grana, PA-C 05/28/19 12:27 PM  [facility]

## 2019-05-29 LAB — COMPLETE METABOLIC PANEL WITH GFR
AG Ratio: 1.1 (calc) (ref 1.0–2.5)
ALT: 13 U/L (ref 6–29)
AST: 17 U/L (ref 10–30)
Albumin: 4.1 g/dL (ref 3.6–5.1)
Alkaline phosphatase (APISO): 100 U/L (ref 31–125)
BUN: 9 mg/dL (ref 7–25)
CO2: 28 mmol/L (ref 20–32)
Calcium: 9.3 mg/dL (ref 8.6–10.2)
Chloride: 104 mmol/L (ref 98–110)
Creat: 0.71 mg/dL (ref 0.50–1.10)
GFR, Est African American: 127 mL/min/{1.73_m2} (ref >=60)
GFR, Est Non African American: 110 mL/min/{1.73_m2} (ref >=60)
Globulin: 3.6 g/dL (calc) (ref 1.9–3.7)
Glucose, Bld: 93 mg/dL (ref 65–99)
Potassium: 4.1 mmol/L (ref 3.5–5.3)
Sodium: 139 mmol/L (ref 135–146)
Total Bilirubin: 0.3 mg/dL (ref 0.2–1.2)
Total Protein: 7.7 g/dL (ref 6.1–8.1)

## 2019-05-29 LAB — LIPID PANEL
Cholesterol: 163 mg/dL (ref <200)
HDL: 49 mg/dL — ABNORMAL LOW (ref >=50)
LDL Cholesterol (Calc): 103 mg/dL (calc) — ABNORMAL HIGH
Non-HDL Cholesterol (Calc): 114 mg/dL (calc) (ref <130)
Total CHOL/HDL Ratio: 3.3 (calc) (ref <5.0)
Triglycerides: 36 mg/dL (ref <150)

## 2019-05-29 LAB — CBC WITH DIFFERENTIAL/PLATELET
Absolute Monocytes: 422 cells/uL (ref 200–950)
Basophils Absolute: 27 cells/uL (ref 0–200)
Basophils Relative: 0.4 %
Eosinophils Absolute: 101 cells/uL (ref 15–500)
Eosinophils Relative: 1.5 %
HCT: 35.7 % (ref 35.0–45.0)
Hemoglobin: 12.2 g/dL (ref 11.7–15.5)
Lymphs Abs: 2044 cells/uL (ref 850–3900)
MCH: 31.9 pg (ref 27.0–33.0)
MCHC: 34.2 g/dL (ref 32.0–36.0)
MCV: 93.5 fL (ref 80.0–100.0)
MPV: 10.2 fL (ref 7.5–12.5)
Monocytes Relative: 6.3 %
Neutro Abs: 4107 cells/uL (ref 1500–7800)
Neutrophils Relative %: 61.3 %
Platelets: 301 10*3/uL (ref 140–400)
RBC: 3.82 10*6/uL (ref 3.80–5.10)
RDW: 12.3 % (ref 11.0–15.0)
Total Lymphocyte: 30.5 %
WBC: 6.7 10*3/uL (ref 3.8–10.8)

## 2019-05-29 LAB — TSH: TSH: 1.65 mIU/L

## 2019-05-29 LAB — HEMOGLOBIN A1C
Hgb A1c MFr Bld: 5.9 % of total Hgb — ABNORMAL HIGH (ref <5.7)
Mean Plasma Glucose: 123 (calc)
eAG (mmol/L): 6.8 (calc)

## 2019-05-31 LAB — CYTOLOGY - PAP
Comment: NEGATIVE
Diagnosis: NEGATIVE
High risk HPV: NEGATIVE

## 2019-08-22 ENCOUNTER — Other Ambulatory Visit: Payer: Self-pay

## 2019-08-22 ENCOUNTER — Ambulatory Visit
Admission: EM | Admit: 2019-08-22 | Discharge: 2019-08-22 | Disposition: A | Discharge status: Home / Self Care | Payer: BC Managed Care – PPO | Attending: Internal Medicine | Admitting: Internal Medicine

## 2019-08-22 ENCOUNTER — Encounter: Payer: Self-pay | Admitting: Emergency Medicine

## 2019-08-22 DIAGNOSIS — Z8249 Family history of ischemic heart disease and other diseases of the circulatory system: Secondary | ICD-10-CM | POA: Diagnosis not present

## 2019-08-22 DIAGNOSIS — Z20822 Contact with and (suspected) exposure to covid-19: Secondary | ICD-10-CM | POA: Insufficient documentation

## 2019-08-22 DIAGNOSIS — J029 Acute pharyngitis, unspecified: Secondary | ICD-10-CM

## 2019-08-22 LAB — GROUP A STREP BY PCR: Group A Strep by PCR: NOT DETECTED

## 2019-08-22 NOTE — ED Provider Notes (Signed)
[facility]    CSN: [id number] Arrival date & time: 08/22/19  1348      History   Chief Complaint Chief Complaint  Patient presents with  . Sore Throat    APPT 1:30pm    HPI Martha Grant is a 39 y.o. female comes to urgent care with complaints of sore throat that started 2 day ago.  The throat is sore.  It is associated with dysphagia.  She is currently trying warm salt water gargle with partial relief.  No fever or chills.  No body aches.  She denies any sick contacts.  No nausea or vomiting.  No headaches or dizziness.  No loss of taste or smell.Marland Kitchen   HPI  Past Medical History:  Diagnosis Date  . Back pain   . Migraines   . 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 Headache disorder 05/14/2019    Past Surgical History:  Procedure Laterality Date  . CESAREAN SECTION     x 2  . ULNA OSTEOTOMY Right 12/21/2013   Procedure: OPEN ULNAR SHORTENING OSTEOTOMY RIGHT ;  Surgeon: Nicki Reaper, MD;  Location: [facility];  Service: Orthopedics;  Laterality: Right;  . WRIST ARTHROSCOPY WITH FOVEAL TRIANGULAR FIBROCARTILAGE COMPLEX REPAIR Right 12/21/2013   Procedure: ARTHROSCOPY DEBRIDEMENT TRIANGULAR CARTILAGE COMPLEX RIGHT WRIST;  Surgeon: Nicki Reaper, MD;  Location: [facility];  Service: Orthopedics;  Laterality: Right;    OB History   No obstetric history on file.      Home Medications    Prior to Admission medications   Medication Sig Start Date End Date Taking? Authorizing Provider  acetaminophen (TYLENOL) 650 MG CR tablet Take 650 mg by mouth every 8 (eight) hours as needed for pain.   Yes [provider]  butalbital-acetaminophen-caffeine (FIORICET) 50-325-40 MG tablet Take 1 tablet by mouth every 4 (four) hours as needed.   Yes [provider]  nortriptyline (PAMELOR) 25 MG capsule  05/22/19  Yes [provider]  SUMAtriptan  (IMITREX) 25 MG tablet  05/10/19  Yes [provider]  baclofen (LIORESAL) 10 MG tablet Take 1 tablet (10 mg total) by mouth 3 (three) times daily as needed for muscle spasms (muscle tightness). Patient not taking: Reported on 05/28/2019 04/12/19   Danelle Berry, PA-C  conjugated estrogens (PREMARIN) vaginal cream Place 1 Applicatorful vaginally daily. 05/28/19   Danelle Berry, PA-C  fluconazole (DIFLUCAN) 150 MG tablet Take 1 tablet (150 mg total) by mouth every 3 (three) days as needed (for vaginal itching/yeast infection sx). Patient not taking: Reported on 05/28/2019 04/27/19   Danelle Berry, PA-C  ibuprofen (ADVIL) 200 MG tablet Take 200 mg by mouth 3 (three) times daily as needed.    [provider]  nortriptyline (PAMELOR) 25 MG capsule Take by mouth. 05/22/19 08/20/19  [provider]    Family History Family History  Problem Relation Age of Onset  . Healthy Mother   . Hypertension Father   . Healthy Brother   . Asthma Son   . Breast cancer Maternal Grandmother   . Hypertension Maternal Grandmother     Social History Social History   Tobacco Use  . Smoking status: Never Smoker  . Smokeless tobacco: Never Used  Substance Use Topics  . Alcohol use: No  . Drug use: No     Allergies   Patient has no known allergies.   Review  of Systems Review of Systems  Constitutional: Negative for activity change, chills, fatigue and fever.  HENT: Positive for sore throat. Negative for congestion, postnasal drip, rhinorrhea, sinus pressure and sinus pain.   Respiratory: Negative for cough and shortness of breath.   Cardiovascular: Negative for chest pain and palpitations.  Gastrointestinal: Negative for abdominal pain, nausea and vomiting.  Genitourinary: Negative.   Musculoskeletal: Negative for arthralgias and joint swelling.  Skin: Negative.   Neurological: Negative for dizziness, numbness and headaches.     Physical Exam Triage Vital Signs ED Triage  Vitals  Enc Vitals Group     BP 08/22/19 1420 (!) 141/94     Pulse Rate 08/22/19 1420 78     Resp 08/22/19 1420 18     Temp 08/22/19 1420 98.6 F (37 C)     Temp Source 08/22/19 1420 Oral     SpO2 08/22/19 1420 99 %     Weight 08/22/19 1417 220 lb (99.8 kg)     Height 08/22/19 1417 5\' 4"  (1.626 m)     Head Circumference --      Peak Flow --      Pain Score 08/22/19 1417 6     Pain Loc --      Pain Edu? --      Excl. in GC? --    No data found.  Updated Vital Signs BP (!) 141/94 (BP Location: Right Arm)   Pulse 78   Temp 98.6 F (37 C) (Oral)   Resp 18   Ht 5\' 4"  (1.626 m)   Wt 99.8 kg   SpO2 99%   BMI 37.76 kg/m   Visual Acuity Right Eye Distance:   Left Eye Distance:   Bilateral Distance:    Right Eye Near:   Left Eye Near:    Bilateral Near:     Physical Exam Constitutional:      General: She is not in acute distress.    Appearance: She is well-developed. She is not ill-appearing.  HENT:     Right Ear: Tympanic membrane normal.     Left Ear: Tympanic membrane normal.     Mouth/Throat:     Mouth: Mucous membranes are moist. No oral lesions.     Pharynx: Posterior oropharyngeal erythema present. No oropharyngeal exudate or uvula swelling.     Tonsils: No tonsillar exudate or tonsillar abscesses. 0 on the right. 0 on the left.  Eyes:     Conjunctiva/sclera: Conjunctivae normal.     Pupils: Pupils are equal, round, and reactive to light.  Cardiovascular:     Rate and Rhythm: Normal rate and regular rhythm.  Pulmonary:     Effort: Pulmonary effort is normal. No respiratory distress.     Breath sounds: Normal breath sounds. No wheezing or rhonchi.  Musculoskeletal:     Cervical back: Normal range of motion and neck supple.  Neurological:     Mental Status: She is alert.      UC Treatments / Results  Labs (all labs ordered are listed, but only abnormal results are displayed) Labs Reviewed  GROUP A STREP BY PCR    EKG   Radiology No results  found.  Procedures Procedures (including critical care time)  Medications Ordered in UC Medications - No data to display  Initial Impression / Assessment and Plan / UC Course  I have reviewed the triage vital signs and the nursing notes.  Pertinent labs & imaging results that were available during my care of the patient were  reviewed by me and considered in my medical decision making (see chart for details).     1.  Viral pharyngitis: COVID-19 testing done Patient is advised to continue his warm salt water gargle Patient is advised to self isolate until COVID-19 test results are available Tylenol/Motrin as needed for pain. Final Clinical Impressions(s) / UC Diagnoses   Final diagnoses:  None   Discharge Instructions   None    ED Prescriptions    None     PDMP not reviewed this encounter.   Chase Picket, MD 08/22/19 1549

## 2019-08-22 NOTE — ED Triage Notes (Addendum)
Patient c/o sore throat that started on Monday. Denies any other symptoms. No exposure to COVID that she is aware of. Patient does not want to be swabbed for COVID.

## 2019-08-23 LAB — NOVEL CORONAVIRUS, NAA (HOSP ORDER, SEND-OUT TO REF LAB; TAT 18-24 HRS): SARS-CoV-2, NAA: NOT DETECTED

## 2020-02-17 ENCOUNTER — Ambulatory Visit
Admission: EM | Admit: 2020-02-17 | Discharge: 2020-02-17 | Disposition: A | Discharge status: Home / Self Care | Payer: BC Managed Care – PPO | Attending: Internal Medicine | Admitting: Internal Medicine

## 2020-02-17 ENCOUNTER — Other Ambulatory Visit: Payer: Self-pay

## 2020-02-17 ENCOUNTER — Encounter: Payer: Self-pay | Admitting: Emergency Medicine

## 2020-02-17 DIAGNOSIS — J028 Acute pharyngitis due to other specified organisms: Secondary | ICD-10-CM | POA: Diagnosis not present

## 2020-02-17 DIAGNOSIS — Z79899 Other long term (current) drug therapy: Secondary | ICD-10-CM | POA: Diagnosis not present

## 2020-02-17 DIAGNOSIS — Z20822 Contact with and (suspected) exposure to covid-19: Secondary | ICD-10-CM | POA: Insufficient documentation

## 2020-02-17 LAB — GROUP A STREP BY PCR: Group A Strep by PCR: NOT DETECTED

## 2020-02-17 NOTE — ED Triage Notes (Signed)
Patient c/o sore throat that started this morning.  Patient denies fevers.  

## 2020-02-17 NOTE — Discharge Instructions (Addendum)
Warm salt water gargle Maintain oral intake If symptoms worsen please return to urgent care to be reevaluated

## 2020-02-18 LAB — SARS CORONAVIRUS 2 (TAT 6-24 HRS): SARS Coronavirus 2: NEGATIVE

## 2020-02-19 NOTE — ED Provider Notes (Signed)
[facility]    CSN: [id number] Arrival date & time: 02/17/20  1524      History   Chief Complaint Chief Complaint  Patient presents with  . Sore Throat    HPI Martha Grant is a 39 y.o. female comes to the urgent care with a history of sore throat since this morning.  Patient denies any fever or chills.  No difficulty swallowing.  No sick contacts.  Patient has not been vaccinated against COVID-19.  No ear pain, nausea or vomiting.Marland Kitchen   HPI  Past Medical History:  Diagnosis Date  . Back pain   . Migraines   . 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 Headache disorder 05/14/2019    Past Surgical History:  Procedure Laterality Date  . CESAREAN SECTION     x 2  . ULNA OSTEOTOMY Right 12/21/2013   Procedure: OPEN ULNAR SHORTENING OSTEOTOMY RIGHT ;  Surgeon: Nicki Reaper, MD;  Location: [facility];  Service: Orthopedics;  Laterality: Right;  . WRIST ARTHROSCOPY WITH FOVEAL TRIANGULAR FIBROCARTILAGE COMPLEX REPAIR Right 12/21/2013   Procedure: ARTHROSCOPY DEBRIDEMENT TRIANGULAR CARTILAGE COMPLEX RIGHT WRIST;  Surgeon: Nicki Reaper, MD;  Location:  SUR[facility]ERY CENTER;  Service: Orthopedics;  Laterality: Right;    OB History   No obstetric history on file.      Home Medications    Prior to Admission medications   Medication Sig Start Date End Date Taking? Authorizing Provider  butalbital-acetaminophen-caffeine (FIORICET) 50-325-40 MG tablet Take 1 tablet by mouth every 4 (four) hours as needed.   Yes [provider]  nortriptyline (PAMELOR) 25 MG capsule  05/22/19  Yes [provider]  SUMAtriptan (IMITREX) 25 MG tablet  05/10/19  Yes [provider]  acetaminophen (TYLENOL) 650 MG CR tablet Take 650 mg by mouth every 8 (eight) hours as needed for pain.    [provider]  nortriptyline (PAMELOR) 25 MG capsule Take by mouth. 05/22/19  08/20/19  [provider]    Family History Family History  Problem Relation Age of Onset  . Healthy Mother   . Hypertension Father   . Healthy Brother   . Asthma Son   . Breast cancer Maternal Grandmother   . Hypertension Maternal Grandmother     Social History Social History   Tobacco Use  . Smoking status: Never Smoker  . Smokeless tobacco: Never Used  Vaping Use  . Vaping Use: Never used  Substance Use Topics  . Alcohol use: No  . Drug use: No     Allergies   Patient has no known allergies.   Review of Systems Review of Systems  Constitutional: Negative for activity change, chills and fever.  HENT: Positive for sore throat. Negative for ear discharge and ear pain.   Respiratory: Negative.   Neurological: Negative.      Physical Exam Triage Vital Signs ED Triage Vitals  Enc Vitals Group     BP 02/17/20 1532 132/82     Pulse Rate 02/17/20 1532 91     Resp 02/17/20 1532 16     Temp 02/17/20 1532 98.8 F (37.1 C)     Temp Source 02/17/20 1532 Oral     SpO2 02/17/20 1532 97 %     Weight 02/17/20 1529 220 lb (99.8 kg)     Height 02/17/20 1529 5\' 4"  (1.626 m)     Head Circumference --  Peak Flow --      Pain Score 02/17/20 1529 5     Pain Loc --      Pain Edu? --      Excl. in GC? --    No data found.  Updated Vital Signs BP 132/82 (BP Location: Left Arm)   Pulse 91   Temp 98.8 F (37.1 C) (Oral)   Resp 16   Ht 5\' 4"  (1.626 m)   Wt 99.8 kg   LMP 01/19/2020 (Exact Date)   SpO2 97%   BMI 37.76 kg/m   Visual Acuity Right Eye Distance:   Left Eye Distance:   Bilateral Distance:    Right Eye Near:   Left Eye Near:    Bilateral Near:     Physical Exam Constitutional:      Appearance: She is well-developed.  HENT:     Right Ear: Tympanic membrane normal.     Left Ear: Tympanic membrane normal.     Nose: No rhinorrhea.     Mouth/Throat:     Mouth: Mucous membranes are moist. Mucous membranes are pale.     Pharynx:  Posterior oropharyngeal erythema present.     Tonsils: No tonsillar exudate. 0 on the right. 0 on the left.  Cardiovascular:     Rate and Rhythm: Normal rate and regular rhythm.     Heart sounds: Normal heart sounds.  Skin:    Capillary Refill: Capillary refill takes less than 2 seconds.  Neurological:     Mental Status: She is alert.      UC Treatments / Results  Labs (all labs ordered are listed, but only abnormal results are displayed) Labs Reviewed  GROUP A STREP BY PCR  SARS CORONAVIRUS 2 (TAT 6-24 HRS)    EKG   Radiology No results found.  Procedures Procedures (including critical care time)  Medications Ordered in UC Medications - No data to display  Initial Impression / Assessment and Plan / UC Course  I have reviewed the triage vital signs and the nursing notes.  Pertinent labs & imaging results that were available during my care of the patient were reviewed by me and considered in my medical decision making (see chart for details).     1.  Acute pharyngitis: COVID-19 PCR Warm salt water gargle If symptoms worsen please return to the urgent care to be reevaluated Group B strep PCR is negative Tylenol/Motrin as needed for pain and/or fever. Final Clinical Impressions(s) / UC Diagnoses   Final diagnoses:  Acute pharyngitis due to other specified organisms     Discharge Instructions     Warm salt water gargle Maintain oral intake If symptoms worsen please return to urgent care to be reevaluated    ED Prescriptions    None     PDMP not reviewed this encounter.   03/20/2020, MD 02/19/20 1704

## 2020-02-21 ENCOUNTER — Telehealth: Payer: BC Managed Care – PPO | Admitting: Physician Assistant

## 2020-02-21 DIAGNOSIS — J069 Acute upper respiratory infection, unspecified: Secondary | ICD-10-CM | POA: Diagnosis not present

## 2020-02-21 MED ORDER — BENZONATATE 100 MG PO CAPS: 100.0000 mg | ORAL_CAPSULE | Freq: Three times a day (TID) | ORAL | 0 refills | Status: AC

## 2020-02-21 NOTE — Progress Notes (Signed)
We are sorry you are not feeling well.  Here is how we plan to help!  Based on what you have shared with me, it looks like you may have a viral upper respiratory infection.  Upper respiratory infections are caused by a large number of viruses; however, rhinovirus is the most common cause.   Symptoms vary from person to person, with common symptoms including sore throat, cough, fatigue or lack of energy and feeling of general discomfort.  A low-grade fever of up to 100.4 may present, but is often uncommon.  Symptoms vary however, and are closely related to a person's age or underlying illnesses.  The most common symptoms associated with an upper respiratory infection are nasal discharge or congestion, cough, sneezing, headache and pressure in the ears and face.  These symptoms usually persist for about 3 to 10 days, but can last up to 2 weeks.  It is important to know that upper respiratory infections do not cause serious illness or complications in most cases.    Upper respiratory infections can be transmitted from person to person, with the most common method of transmission being a person's hands.  The virus is able to live on the skin and can infect other persons for up to 2 hours after direct contact.  Also, these can be transmitted when someone coughs or sneezes; thus, it is important to cover the mouth to reduce this risk.  To keep the spread of the illness at bay, good hand hygiene is very important.  This is an infection that is most likely caused by a virus. There are no specific treatments other than to help you with the symptoms until the infection runs its course.  We are sorry you are not feeling well.  Here is how we plan to help!   For nasal congestion, you may use an oral decongestants such as Mucinex D or if you have glaucoma or high blood pressure use plain Mucinex.  Saline nasal spray or nasal drops can help and can safely be used as often as needed for congestion.   If you do not  have a history of heart disease, hypertension, diabetes or thyroid disease, prostate/bladder issues or glaucoma, you may also use Sudafed to treat nasal congestion.  It is highly recommended that you consult with a pharmacist or your primary care physician to ensure this medication is safe for you to take.     If you have a cough, you may use cough suppressants such as Delsym and Robitussin.  If you have glaucoma or high blood pressure, you can also use Coricidin HBP.   For cough I have prescribed for you A prescription cough medication called Tessalon Perles 100 mg. You may take 1-2 capsules every 8 hours as needed for cough  If you have a sore or scratchy throat, use a saltwater gargle-  to  teaspoon of salt dissolved in a 4-ounce to 8-ounce glass of warm water.  Gargle the solution for approximately 15-30 seconds and then spit.  It is important not to swallow the solution.  You can also use throat lozenges/cough drops and Chloraseptic spray to help with throat pain or discomfort.  Warm or cold liquids can also be helpful in relieving throat pain.  For headache, pain or general discomfort, you can use Ibuprofen or Tylenol as directed.   Some authorities believe that zinc sprays or the use of Echinacea may shorten the course of your symptoms.   HOME CARE . Only take medications as   instructed by your medical team. . Be sure to drink plenty of fluids. Water is fine as well as fruit juices, sodas and electrolyte beverages. You may want to stay away from caffeine or alcohol. If you are nauseated, try taking small sips of liquids. How do you know if you are getting enough fluid? Your urine should be a pale yellow or almost colorless. . Get rest. . Taking a steamy shower or using a humidifier may help nasal congestion and ease sore throat pain. You can place a towel over your head and breathe in the steam from hot water coming from a faucet. . Using a saline nasal spray works much the same way. . Cough  drops, hard candies and sore throat lozenges may ease your cough. . Avoid close contacts especially the very young and the elderly . Cover your mouth if you cough or sneeze . Always remember to wash your hands.   GET HELP RIGHT AWAY IF: . You develop worsening fever. . If your symptoms do not improve within 10 days . You develop yellow or green discharge from your nose over 3 days. . You have coughing fits . You develop a severe head ache or visual changes. . You develop shortness of breath, difficulty breathing or start having chest pain . Your symptoms persist after you have completed your treatment plan  MAKE SURE YOU   Understand these instructions.  Will watch your condition.  Will get help right away if you are not doing well or get worse.  Your e-visit answers were reviewed by a board certified advanced clinical practitioner to complete your personal care plan. Depending upon the condition, your plan could have included both over the counter or prescription medications. Please review your pharmacy choice. If there is a problem, you may call our nursing hot line at and have the prescription routed to another pharmacy. Your safety is important to us. If you have drug allergies check your prescription carefully.   You can use MyChart to ask questions about today's visit, request a non-urgent call back, or ask for a work or school excuse for 24 hours related to this e-Visit. If it has been greater than 24 hours you will need to follow up with your provider, or enter a new e-Visit to address those concerns. You will get an e-mail in the next two days asking about your experience.  I hope that your e-visit has been valuable and will speed your recovery. Thank you for using e-visits.   Approximately 5 minutes was spent documenting and reviewing patient's chart.    

## 2020-03-03 ENCOUNTER — Encounter: Payer: Self-pay | Admitting: Family Medicine

## 2020-03-20 ENCOUNTER — Other Ambulatory Visit: Payer: Self-pay

## 2020-03-20 ENCOUNTER — Encounter: Payer: Self-pay | Admitting: Emergency Medicine

## 2020-03-20 ENCOUNTER — Ambulatory Visit
Admission: EM | Admit: 2020-03-20 | Discharge: 2020-03-20 | Disposition: A | Discharge status: Home / Self Care | Payer: BC Managed Care – PPO | Attending: Family Medicine | Admitting: Family Medicine

## 2020-03-20 DIAGNOSIS — J029 Acute pharyngitis, unspecified: Secondary | ICD-10-CM | POA: Diagnosis not present

## 2020-03-20 LAB — GROUP A STREP BY PCR: Group A Strep by PCR: NOT DETECTED

## 2020-03-20 MED ORDER — AMOXICILLIN 500 MG PO TABS: 500.0000 mg | ORAL_TABLET | Freq: Two times a day (BID) | ORAL | 0 refills | Status: DC

## 2020-03-20 NOTE — ED Triage Notes (Signed)
Patient c/o sore throat that started 2 days ago. Patient states she had a fever of 100.0.

## 2020-03-22 NOTE — ED Provider Notes (Signed)
[facility]    CSN: [id number] Arrival date & time: 03/20/20  1712   History   Chief Complaint Chief Complaint  Patient presents with  . Sore Throat   HPI  39 year old female presents with sore throat.   2 day history of sore throat.  Associated fever, Tmax 102.2. No sick contacts. Pain 8/10 in severity. Painful swallowing. No other symptoms. No other complaints or concerns at this time.  Past Medical History:  Diagnosis Date  . Back pain   . Migraines   . 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 Headache disorder 05/14/2019   Past Surgical History:  Procedure Laterality Date  . CESAREAN SECTION     x 2  . ULNA OSTEOTOMY Right 12/21/2013   Procedure: OPEN ULNAR SHORTENING OSTEOTOMY RIGHT ;  Surgeon: Nicki Reaper, MD;  Location: [facility];  Service: Orthopedics;  Laterality: Right;  . WRIST ARTHROSCOPY WITH FOVEAL TRIANGULAR FIBROCARTILAGE COMPLEX REPAIR Right 12/21/2013   Procedure: ARTHROSCOPY DEBRIDEMENT TRIANGULAR CARTILAGE COMPLEX RIGHT WRIST;  Surgeon: Nicki Reaper, MD;  Location: [facility];  Service: Orthopedics;  Laterality: Right;    OB History   No obstetric history on file.    Home Medications    Prior to Admission medications   Medication Sig Start Date End Date Taking? Authorizing Provider  acetaminophen (TYLENOL) 650 MG CR tablet Take 650 mg by mouth every 8 (eight) hours as needed for pain.   Yes [provider]  butalbital-acetaminophen-caffeine (FIORICET) 50-325-40 MG tablet Take 1 tablet by mouth every 4 (four) hours as needed.   Yes [provider]  nortriptyline (PAMELOR) 25 MG capsule  05/22/19  Yes [provider]  SUMAtriptan (IMITREX) 25 MG tablet  05/10/19  Yes [provider]  amoxicillin (AMOXIL) 500 MG tablet Take 1 tablet (500 mg total) by mouth 2 (two) times daily. 03/20/20   Tommie Sams, DO    nortriptyline (PAMELOR) 25 MG capsule Take by mouth. 05/22/19 08/20/19  [provider]    Family History Family History  Problem Relation Age of Onset  . Healthy Mother   . Hypertension Father   . Healthy Brother   . Asthma Son   . Breast cancer Maternal Grandmother   . Hypertension Maternal Grandmother     Social History Social History   Tobacco Use  . Smoking status: Never Smoker  . Smokeless tobacco: Never Used  Vaping Use  . Vaping Use: Never used  Substance Use Topics  . Alcohol use: No  . Drug use: No     Allergies   Patient has no known allergies.   Review of Systems Review of Systems  Constitutional: Positive for fever.  HENT: Positive for sore throat and trouble swallowing.    Physical Exam Triage Vital Signs ED Triage Vitals  Enc Vitals Group     BP 03/20/20 1904 123/85     Pulse Rate 03/20/20 1903 85     Resp 03/20/20 1903 18     Temp 03/20/20 1903 98.7 F (37.1 C)     Temp Source 03/20/20 1903 Oral     SpO2 03/20/20 1903 99 %     Weight 03/20/20 1901 220 lb (99.8 kg)     Height 03/20/20 1901 5\' 4"  (1.626 m)     Head Circumference --      Peak Flow --  Pain Score 03/20/20 1901 8     Pain Loc --      Pain Edu? --      Excl. in GC? --    Updated Vital Signs BP 123/85   Pulse 85   Temp 98.7 F (37.1 C) (Oral)   Resp 18   Ht 5\' 4"  (1.626 m)   Wt 99.8 kg   SpO2 99%   BMI 37.76 kg/m   Visual Acuity Right Eye Distance:   Left Eye Distance:   Bilateral Distance:    Right Eye Near:   Left Eye Near:    Bilateral Near:     Physical Exam Vitals and nursing note reviewed.  Constitutional:      Appearance: Normal appearance. She is obese.  HENT:     Head: Normocephalic and atraumatic.     Mouth/Throat:     Pharynx: Posterior oropharyngeal erythema present.     Tonsils: Tonsillar exudate present. 2+ on the right. 2+ on the left.  Eyes:     General:        Right eye: No discharge.        Left eye: No discharge.      Conjunctiva/sclera: Conjunctivae normal.  Cardiovascular:     Rate and Rhythm: Normal rate and regular rhythm.  Pulmonary:     Effort: Pulmonary effort is normal.     Breath sounds: Rales present. No wheezing or rhonchi.  Neurological:     Mental Status: She is alert.    UC Treatments / Results  Labs (all labs ordered are listed, but only abnormal results are displayed) Labs Reviewed  GROUP A STREP BY PCR    EKG   Radiology No results found.  Procedures Procedures (including critical care time)  Medications Ordered in UC Medications - No data to display  Initial Impression / Assessment and Plan / UC Course  I have reviewed the triage vital signs and the nursing notes.  Pertinent labs & imaging results that were available during my care of the patient were reviewed by me and considered in my medical decision making (see chart for details).    39 year old female presents with acute pharyngitis. Strep PCR negative. Given fever and exam findings, placing on Amoxicillin.   Final Clinical Impressions(s) / UC Diagnoses   Final diagnoses:  Acute pharyngitis, unspecified etiology   Discharge Instructions   None    ED Prescriptions    Medication Sig Dispense Auth. Provider   amoxicillin (AMOXIL) 500 MG tablet Take 1 tablet (500 mg total) by mouth 2 (two) times daily. 20 tablet 31, DO     PDMP not reviewed this encounter.   Tommie Sams, Tommie Sams 03/22/20 1149

## 2020-05-28 ENCOUNTER — Encounter: Payer: BC Managed Care – PPO | Admitting: Family Medicine

## 2020-07-11 ENCOUNTER — Other Ambulatory Visit: Payer: Self-pay

## 2020-07-11 ENCOUNTER — Ambulatory Visit (INDEPENDENT_AMBULATORY_CARE_PROVIDER_SITE_OTHER): Payer: BC Managed Care – PPO | Admitting: Family Medicine

## 2020-07-11 ENCOUNTER — Encounter: Payer: Self-pay | Admitting: Family Medicine

## 2020-07-11 VITALS — BP 126/76 | HR 81 | Temp 98.8°F | Resp 16 | Ht 64.0 in | Wt 240.1 lb

## 2020-07-11 DIAGNOSIS — E669 Obesity, unspecified: Secondary | ICD-10-CM | POA: Diagnosis not present

## 2020-07-11 DIAGNOSIS — Z0000 Encounter for general adult medical examination without abnormal findings: Secondary | ICD-10-CM | POA: Diagnosis not present

## 2020-07-11 DIAGNOSIS — Z23 Encounter for immunization: Secondary | ICD-10-CM

## 2020-07-11 DIAGNOSIS — R519 Headache, unspecified: Secondary | ICD-10-CM

## 2020-07-11 DIAGNOSIS — Z1159 Encounter for screening for other viral diseases: Secondary | ICD-10-CM | POA: Diagnosis not present

## 2020-07-11 NOTE — Patient Instructions (Signed)
Preventive Care 20-37 Years Old, Female Preventive care refers to visits with your health care provider and lifestyle choices that can promote health and wellness. This includes:  A yearly physical exam. This may also be called an annual well check.  Regular dental visits and eye exams.  Immunizations.  Screening for certain conditions.  Healthy lifestyle choices, such as eating a healthy diet, getting regular exercise, not using drugs or products that contain nicotine and tobacco, and limiting alcohol use. What can I expect for my preventive care visit? Physical exam Your health care provider will check your:  Height and weight. This may be used to calculate body mass index (BMI), which tells if you are at a healthy weight.  Heart rate and blood pressure.  Skin for abnormal spots. Counseling Your health care provider may ask you questions about your:  Alcohol, tobacco, and drug use.  Emotional well-being.  Home and relationship well-being.  Sexual activity.  Eating habits.  Work and work Statistician.  Method of birth control.  Menstrual cycle.  Pregnancy history. What immunizations do I need?  Influenza (flu) vaccine  This is recommended every year. Tetanus, diphtheria, and pertussis (Tdap) vaccine  You may need a Td booster every 10 years. Varicella (chickenpox) vaccine  You may need this if you have not been vaccinated. Human papillomavirus (HPV) vaccine  If recommended by your health care provider, you may need three doses over 6 months. Measles, mumps, and rubella (MMR) vaccine  You may need at least one dose of MMR. You may also need a second dose. Meningococcal conjugate (MenACWY) vaccine  One dose is recommended if you are age 75-21 years and a first-year college student living in a residence hall, or if you have one of several medical conditions. You may also need additional booster doses. Pneumococcal conjugate (PCV13) vaccine  You may need  this if you have certain conditions and were not previously vaccinated. Pneumococcal polysaccharide (PPSV23) vaccine  You may need one or two doses if you smoke cigarettes or if you have certain conditions. Hepatitis A vaccine  You may need this if you have certain conditions or if you travel or work in places where you may be exposed to hepatitis A. Hepatitis B vaccine  You may need this if you have certain conditions or if you travel or work in places where you may be exposed to hepatitis B. Haemophilus influenzae type b (Hib) vaccine  You may need this if you have certain conditions. You may receive vaccines as individual doses or as more than one vaccine together in one shot (combination vaccines). Talk with your health care provider about the risks and benefits of combination vaccines. What tests do I need?  Blood tests  Lipid and cholesterol levels. These may be checked every 5 years starting at age 33.  Hepatitis C test.  Hepatitis B test. Screening  Diabetes screening. This is done by checking your blood sugar (glucose) after you have not eaten for a while (fasting).  Sexually transmitted disease (STD) testing.  BRCA-related cancer screening. This may be done if you have a family history of breast, ovarian, tubal, or peritoneal cancers.  Pelvic exam and Pap test. This may be done every 3 years starting at age 76. Starting at age 102, this may be done every 5 years if you have a Pap test in combination with an HPV test. Talk with your health care provider about your test results, treatment options, and if necessary, the need for more tests.  Follow these instructions at home: Eating and drinking   Eat a diet that includes fresh fruits and vegetables, whole grains, lean protein, and low-fat dairy.  Take vitamin and mineral supplements as recommended by your health care provider.  Do not drink alcohol if: ? Your health care provider tells you not to drink. ? You are  pregnant, may be pregnant, or are planning to become pregnant.  If you drink alcohol: ? Limit how much you have to 0-1 drink a day. ? Be aware of how much alcohol is in your drink. In the [place] one drink equals one 12 oz bottle of beer (355 mL), one 5 oz glass of wine (148 mL), or one 1 oz glass of hard liquor (44 mL). Lifestyle  Take daily care of your teeth and gums.  Stay active. Exercise for at least 30 minutes on 5 or more days each week.  Do not use any products that contain nicotine or tobacco, such as cigarettes, e-cigarettes, and chewing tobacco. If you need help quitting, ask your health care provider.  If you are sexually active, practice safe sex. Use a condom or other form of birth control (contraception) in order to prevent pregnancy and STIs (sexually transmitted infections). If you plan to become pregnant, see your health care provider for a preconception visit. What's next?  Visit your health care provider once a year for a well check visit.  Ask your health care provider how often you should have your eyes and teeth checked.  Stay up to date on all vaccines. This information is not intended to replace advice given to you by your health care provider. Make sure you discuss any questions you have with your health care provider. Document Revised: 04/06/2018 Document Reviewed: 04/06/2018 Elsevier Patient Education  2020 [employer].

## 2020-07-11 NOTE — Progress Notes (Signed)
Patient: Martha Grant, Female    DOB: June 16, 1981, 39 y.o.   MRN: [id number] Danelle Berry, PA-C  Visit Date: 07/11/2020  Today's Provider: Danelle Berry, PA-C   Chief Complaint  Patient presents with  . Annual Exam   Subjective:   Annual physical exam:  Martha Grant is a 39 y.o. female who presents today for complete physical exam:  Exercise/Activity: not active right now  Diet/nutrition:  "bad" right now Sleep: sleeps well,. Feels rested   USPSTF grade A and B recommendations - reviewed and addressed today  Depression:  Phq 9 completed today by patient, was reviewed by me with patient in the room PHQ score is neg, reviewed today PHQ 2/9 Scores 07/11/2020 05/28/2019 04/27/2019 04/12/2019  PHQ - 2 Score 0 0 0 0  PHQ- 9 Score - 0 0 0   Depression screen [facility] 2/9 07/11/2020 05/28/2019 04/27/2019 04/12/2019  Decreased Interest 0 0 0 0  Down, Depressed, Hopeless 0 0 0 0  PHQ - 2 Score 0 0 0 0  Altered sleeping - 0 0 0  Tired, decreased energy - 0 0 0  Change in appetite - 0 0 0  Feeling bad or failure about yourself  - 0 0 0  Trouble concentrating - 0 0 0  Moving slowly or fidgety/restless - 0 0 0  Suicidal thoughts - 0 0 0  PHQ-9 Score - 0 0 0  Difficult doing work/chores - Not difficult at all Not difficult at all -    Alcohol screening:   Office Visit from 07/11/2020 in [facility]  AUDIT-C Score 0      Immunizations and Health Maintenance: Health Maintenance  Topic Date Due  . Hepatitis C Screening  Never done  . TETANUS/TDAP  08/09/2020  . PAP SMEAR-Modifier  05/27/2022  . INFLUENZA VACCINE  Completed  . COVID-19 Vaccine  Completed  . HIV Screening  Completed     Hep C Screening: due  STD testing and prevention (HIV/chl/gon/syphilis):  see above, no additional testing desired by pt today  Intimate partner violence:  denies  Sexual History/Pain during Intercourse: Significant Other  Menstrual History/LMP/Abnormal Bleeding:   Patient's  last menstrual period was 06/23/2020.  Incontinence Symptoms: denies  Breast cancer:  Last Mammogram: *see HM list above Not yet due for age  Cervical cancer screening: UTD No family hx of cancers - breast, ovarian, uterine, colon:     Osteoporosis:   Discussion on osteoporosis per age, including high calcium and vitamin D supplementation, weight bearing exercises  Skin cancer:  Hx of skin CA -  NO Discussed atypical lesions   Colorectal cancer:  N/a for age Discussed concerning signs and sx of CRC, pt denies   Lung cancer:   Low Dose CT Chest recommended if Age 60-80 years, 30 pack-year currently smoking OR have quit w/in 15years. Patient does not qualify.    Social History   Tobacco Use  . Smoking status: Never Smoker  . Smokeless tobacco: Never Used  Vaping Use  . Vaping Use: Never used  Substance Use Topics  . Alcohol use: No  . Drug use: No       Office Visit from 07/11/2020 in [facility]  AUDIT-C Score 0      Family History  Problem Relation Age of Onset  . Healthy Mother   . Hypertension Father   . Healthy Brother   . Asthma Son   . Breast cancer Maternal Grandmother   .  Hypertension Maternal Grandmother      Blood pressure/Hypertension: BP Readings from Last 3 Encounters:  07/11/20 126/76  03/20/20 123/85  02/17/20 132/82    Weight/Obesity: Wt Readings from Last 3 Encounters:  07/11/20 240 lb 1.6 oz (108.9 kg)  03/20/20 220 lb (99.8 kg)  02/17/20 220 lb (99.8 kg)   BMI Readings from Last 3 Encounters:  07/11/20 41.21 kg/m  03/20/20 37.76 kg/m  02/17/20 37.76 kg/m     Lipids:  Lab Results  Component Value Date   CHOL 163 05/28/2019   Lab Results  Component Value Date   HDL 49 (L) 05/28/2019   Lab Results  Component Value Date   LDLCALC 103 (H) 05/28/2019   Lab Results  Component Value Date   TRIG 36 05/28/2019   Lab Results  Component Value Date   CHOLHDL 3.3 05/28/2019   No results found for:  LDLDIRECT Based on the results of lipid panel his/her cardiovascular risk factor ( using [place] Cohort )  in the next 10 years is: The ASCVD Risk score Denman George DC Jr., et al., 2013) failed to calculate for the following reasons:   The 2013 ASCVD risk score is only valid for ages 81 to 64 Glucose:  Glucose, Bld  Date Value Ref Range Status  05/28/2019 93 65 - 99 mg/dL Final    Comment:    .            Fasting reference interval .    Hypertension: BP Readings from Last 3 Encounters:  07/11/20 126/76  03/20/20 123/85  02/17/20 132/82   Obesity: Wt Readings from Last 3 Encounters:  07/11/20 240 lb 1.6 oz (108.9 kg)  03/20/20 220 lb (99.8 kg)  02/17/20 220 lb (99.8 kg)   BMI Readings from Last 3 Encounters:  07/11/20 41.21 kg/m  03/20/20 37.76 kg/m  02/17/20 37.76 kg/m      Advanced Care Planning:  A voluntary discussion about advance care planning including the explanation and discussion of advance directives.   Discussed health care proxy and Living will, and the patient was able to identify a health care proxy as  Patient does not have a living will at present time.   Social History      She        Social History   Socioeconomic History  . Marital status: Significant Other    Spouse name: Not on file  . Number of children: 1  . Years of education: 96  . Highest education level: Associate degree: academic program  Occupational History  . Not on file  Tobacco Use  . Smoking status: Never Smoker  . Smokeless tobacco: Never Used  Vaping Use  . Vaping Use: Never used  Substance and Sexual Activity  . Alcohol use: No  . Drug use: No  . Sexual activity: Yes  Other Topics Concern  . Not on file  Social History Narrative  . Not on file   Social Determinants of Health   Financial Resource Strain: Low Risk   . Difficulty of Paying Living Expenses: Not hard at all  Food Insecurity: No Food Insecurity  . Worried About Programme researcher, broadcasting/film/video in the Last Year: Never  true  . Ran Out of Food in the Last Year: Never true  Transportation Needs: No Transportation Needs  . Lack of Transportation (Medical): No  . Lack of Transportation (Non-Medical): No  Physical Activity: Inactive  . Days of Exercise per Week: 0 days  . Minutes of Exercise per Session:  0 min  Stress: No Stress Concern Present  . Feeling of Stress : Not at all  Social Connections: Unknown  . Frequency of Communication with Friends and Family: More than three times a week  . Frequency of Social Gatherings with Friends and Family: Three times a week  . Attends Religious Services: Patient refused  . Active Member of Clubs or Organizations: Patient refused  . Attends BankerClub or Organization Meetings: Patient refused  . Marital Status: Patient refused    Family History        Family History  Problem Relation Age of Onset  . Healthy Mother   . Hypertension Father   . Healthy Brother   . Asthma Son   . Breast cancer Maternal Grandmother   . Hypertension Maternal Grandmother     Patient Active Problem List   Diagnosis Date Noted  . Headache disorder 05/14/2019    Past Surgical History:  Procedure Laterality Date  . CESAREAN SECTION     x 2  . ULNA OSTEOTOMY Right 12/21/2013   Procedure: OPEN ULNAR SHORTENING OSTEOTOMY RIGHT ;  Surgeon: Nicki ReaperGary R Kuzma, MD;  Location: [facility];  Service: Orthopedics;  Laterality: Right;  . WRIST ARTHROSCOPY WITH FOVEAL TRIANGULAR FIBROCARTILAGE COMPLEX REPAIR Right 12/21/2013   Procedure: ARTHROSCOPY DEBRIDEMENT TRIANGULAR CARTILAGE COMPLEX RIGHT WRIST;  Surgeon: Nicki ReaperGary R Kuzma, MD;  Location: [facility];  Service: Orthopedics;  Laterality: Right;     Current Outpatient Medications:  .  butalbital-acetaminophen-caffeine (FIORICET) 50-325-40 MG tablet, Take 1 tablet by mouth every 4 (four) hours as needed., Disp: , Rfl:  .  nortriptyline (PAMELOR) 50 MG capsule, Take 50 mg by mouth at bedtime., Disp: , Rfl:  .  propranolol  (INDERAL) 20 MG tablet, Take 20 mg by mouth in the morning and at bedtime., Disp: , Rfl:  .  acetaminophen (TYLENOL) 650 MG CR tablet, Take 650 mg by mouth every 8 (eight) hours as needed for pain. (Patient not taking: Reported on 07/11/2020), Disp: , Rfl:  .  SUMAtriptan (IMITREX) 25 MG tablet, , Disp: , Rfl:   No Known Allergies  Patient Care Team: Danelle Berryapia, Pal Shell, PA-C as PCP - General (Family Medicine)  Review of Systems  10 Systems reviewed and are negative for acute change except as noted in the HPI.  I personally reviewed active problem list, medication list, allergies, family history, social history, health maintenance, notes from last encounter, lab results, imaging with the patient/caregiver today.        Objective:   Vitals:  Vitals:   07/11/20 1304  BP: 126/76  Pulse: 81  Resp: 16  Temp: 98.8 F (37.1 C)  TempSrc: Oral  SpO2: 99%  Weight: 240 lb 1.6 oz (108.9 kg)  Height: 5\' 4"  (1.626 m)    Body mass index is 41.21 kg/m.  Physical Exam Vitals and nursing note reviewed.  Constitutional:      General: She is not in acute distress.    Appearance: Normal appearance. She is well-developed. She is obese. She is not ill-appearing, toxic-appearing or diaphoretic.     Interventions: Face mask in place.  HENT:     Head: Normocephalic and atraumatic.     Right Ear: External ear normal.     Left Ear: External ear normal.  Eyes:     General: Lids are normal. No scleral icterus.       Right eye: No discharge.        Left eye: No discharge.  Conjunctiva/sclera: Conjunctivae normal.  Neck:     Trachea: Phonation normal. No tracheal deviation.  Cardiovascular:     Rate and Rhythm: Normal rate and regular rhythm.     Pulses: Normal pulses.          Radial pulses are 2+ on the right side and 2+ on the left side.       Posterior tibial pulses are 2+ on the right side and 2+ on the left side.     Heart sounds: Normal heart sounds. No murmur heard.  No friction rub. No  gallop.   Pulmonary:     Effort: Pulmonary effort is normal. No respiratory distress.     Breath sounds: Normal breath sounds. No stridor. No wheezing, rhonchi or rales.  Chest:     Chest wall: No tenderness.  Abdominal:     General: Bowel sounds are normal. There is no distension.     Palpations: Abdomen is soft.  Musculoskeletal:     Right lower leg: No edema.     Left lower leg: No edema.  Skin:    General: Skin is warm and dry.     Coloration: Skin is not jaundiced or pale.     Findings: No rash.  Neurological:     Mental Status: She is alert.     Motor: No abnormal muscle tone.     Gait: Gait normal.  Psychiatric:        Mood and Affect: Mood normal.        Speech: Speech normal.        Behavior: Behavior normal.       Fall Risk: Fall Risk  07/11/2020 05/28/2019 04/27/2019 04/12/2019  Falls in the past year? 0 0 0 0  Number falls in past yr: 0 0 0 0  Injury with Fall? 0 0 0 0  Follow up Falls evaluation completed - - -    Functional Status Survey: Is the patient deaf or have difficulty hearing?: No Does the patient have difficulty seeing, even when wearing glasses/contacts?: No Does the patient have difficulty concentrating, remembering, or making decisions?: No Does the patient have difficulty walking or climbing stairs?: No Does the patient have difficulty dressing or bathing?: No Does the patient have difficulty doing errands alone such as visiting a doctor's office or shopping?: No   Assessment & Plan:    CPE completed today  . USPSTF grade A and B recommendations reviewed with patient; age-appropriate recommendations, preventive care, screening tests, etc discussed and encouraged; healthy living encouraged; see AVS for patient education given to patient  . Discussed importance of 150 minutes of physical activity weekly, AHA exercise recommendations given to pt in AVS/handout  . Discussed importance of healthy diet:  eating lean meats and proteins, avoiding  trans fats and saturated fats, avoid simple sugars and excessive carbs in diet, eat 6 servings of fruit/vegetables daily and drink plenty of water and avoid sweet beverages.    . Recommended pt to do annual eye exam and routine dental exams/cleanings  . Depression, alcohol, fall screening completed as documented above and per flowsheets  . Reviewed Health Maintenance: Health Maintenance  Topic Date Due  . Hepatitis C Screening  Never done  . TETANUS/TDAP  08/09/2020  . PAP SMEAR-Modifier  05/27/2022  . INFLUENZA VACCINE  Completed  . COVID-19 Vaccine  Completed  . HIV Screening  Completed    . Immunizations: Immunization History  Administered Date(s) Administered  . Influenza-Unspecified 05/23/2019, 06/25/2020  . PFIZER SARS-COV-2 Vaccination  04/03/2020, 04/24/2020  . Varicella 12/05/2010     ICD-10-CM   1. Adult general medical exam  Z00.00 CBC with Differential/Platelet    COMPLETE METABOLIC PANEL WITH GFR    Lipid panel  2. Class 2 obesity in adult, unspecified BMI, unspecified obesity type, unspecified whether serious comorbidity present  E66.9   3. Encounter for hepatitis C screening test for low risk patient  Z11.59 Hepatitis C antibody  4. Need for influenza vaccination  Z23   5. Headache disorder  R51.9    see below - managed by neurology, better, HA's about once a week  Migraine management changed now on propanolol nortriptyline and prn fioricet      Danelle Berry, PA-C 07/11/20 1:10 PM  [facility]

## 2020-07-14 LAB — COMPLETE METABOLIC PANEL WITH GFR
AG Ratio: 1.2 (calc) (ref 1.0–2.5)
ALT: 14 U/L (ref 6–29)
AST: 15 U/L (ref 10–30)
Albumin: 4 g/dL (ref 3.6–5.1)
Alkaline phosphatase (APISO): 99 U/L (ref 31–125)
BUN: 11 mg/dL (ref 7–25)
CO2: 30 mmol/L (ref 20–32)
Calcium: 9.1 mg/dL (ref 8.6–10.2)
Chloride: 103 mmol/L (ref 98–110)
Creat: 0.77 mg/dL (ref 0.50–1.10)
GFR, Est African American: 114 mL/min/{1.73_m2} (ref >=60)
GFR, Est Non African American: 99 mL/min/{1.73_m2} (ref >=60)
Globulin: 3.3 g/dL (calc) (ref 1.9–3.7)
Glucose, Bld: 88 mg/dL (ref 65–99)
Potassium: 4.6 mmol/L (ref 3.5–5.3)
Sodium: 137 mmol/L (ref 135–146)
Total Bilirubin: 0.3 mg/dL (ref 0.2–1.2)
Total Protein: 7.3 g/dL (ref 6.1–8.1)

## 2020-07-14 LAB — CBC WITH DIFFERENTIAL/PLATELET
Absolute Monocytes: 559 cells/uL (ref 200–950)
Basophils Absolute: 41 cells/uL (ref 0–200)
Basophils Relative: 0.5 %
Eosinophils Absolute: 122 cells/uL (ref 15–500)
Eosinophils Relative: 1.5 %
HCT: 36.4 % (ref 35.0–45.0)
Hemoglobin: 12.2 g/dL (ref 11.7–15.5)
Lymphs Abs: 2438 cells/uL (ref 850–3900)
MCH: 31.6 pg (ref 27.0–33.0)
MCHC: 33.5 g/dL (ref 32.0–36.0)
MCV: 94.3 fL (ref 80.0–100.0)
MPV: 10.3 fL (ref 7.5–12.5)
Monocytes Relative: 6.9 %
Neutro Abs: 4941 cells/uL (ref 1500–7800)
Neutrophils Relative %: 61 %
Platelets: 298 10*3/uL (ref 140–400)
RBC: 3.86 10*6/uL (ref 3.80–5.10)
RDW: 12.6 % (ref 11.0–15.0)
Total Lymphocyte: 30.1 %
WBC: 8.1 10*3/uL (ref 3.8–10.8)

## 2020-07-14 LAB — ADVANCED WRITTEN NOTIFICATION (AWN) TEST REFUSAL

## 2020-07-14 LAB — HEPATITIS C ANTIBODY
Hepatitis C Ab: NONREACTIVE
SIGNAL TO CUT-OFF: 0.02 (ref <1.00)

## 2020-10-07 ENCOUNTER — Encounter: Payer: Self-pay | Admitting: Family Medicine

## 2020-10-10 ENCOUNTER — Ambulatory Visit
Admission: EM | Admit: 2020-10-10 | Discharge: 2020-10-10 | Disposition: A | Discharge status: Home / Self Care | Payer: BC Managed Care – PPO | Attending: Physician Assistant | Admitting: Physician Assistant

## 2020-10-10 ENCOUNTER — Encounter: Payer: Self-pay | Admitting: Emergency Medicine

## 2020-10-10 ENCOUNTER — Other Ambulatory Visit: Payer: Self-pay

## 2020-10-10 DIAGNOSIS — O1210 Gestational proteinuria, unspecified trimester: Secondary | ICD-10-CM

## 2020-10-10 DIAGNOSIS — R197 Diarrhea, unspecified: Secondary | ICD-10-CM | POA: Diagnosis present

## 2020-10-10 DIAGNOSIS — R81 Glycosuria: Secondary | ICD-10-CM | POA: Diagnosis not present

## 2020-10-10 DIAGNOSIS — R112 Nausea with vomiting, unspecified: Secondary | ICD-10-CM | POA: Diagnosis not present

## 2020-10-10 DIAGNOSIS — Z3201 Encounter for pregnancy test, result positive: Secondary | ICD-10-CM

## 2020-10-10 DIAGNOSIS — R059 Cough, unspecified: Secondary | ICD-10-CM

## 2020-10-10 DIAGNOSIS — R809 Proteinuria, unspecified: Secondary | ICD-10-CM | POA: Diagnosis not present

## 2020-10-10 LAB — URINALYSIS, COMPLETE (UACMP) WITH MICROSCOPIC
Glucose, UA: 100 mg/dL — AB
Hgb urine dipstick: NEGATIVE
Ketones, ur: 80 mg/dL — AB
Nitrite: NEGATIVE
Protein, ur: 30 mg/dL — AB
Specific Gravity, Urine: >1.03 — ABNORMAL HIGH (ref 1.005–1.030)
WBC, UA: >50 WBC/hpf (ref 0–5)
pH: 6 (ref 5.0–8.0)

## 2020-10-10 LAB — PREGNANCY, URINE: Preg Test, Ur: POSITIVE — AB

## 2020-10-10 MED ORDER — DOXYLAMINE-PYRIDOXINE 10-10 MG PO TBEC: 1.0000 | DELAYED_RELEASE_TABLET | Freq: Three times a day (TID) | ORAL | 0 refills | Status: AC | PRN

## 2020-10-10 MED ORDER — ONDANSETRON 4 MG PO TBDP
4.0000 mg | ORAL_TABLET | Freq: Once | ORAL | Status: AC
  Administered 2020-10-10: 4 mg via ORAL

## 2020-10-10 NOTE — ED Provider Notes (Signed)
[facility]    CSN: [id number] Arrival date & time: 10/10/20  1318      History   Chief Complaint Chief Complaint  Patient presents with  . Emesis  . Chills  . Cough    HPI Martha Grant is a 40 y.o. female presenting for about a week history of chills, fatigue, and productive cough.  She says that she has started to have some nausea, loose stools, and vomiting this week.  Admits to about 2 episodes of vomiting a day.  She denies any fever, body aches, sore throat, runny nose, headaches, chest pain or breathing difficulty, abdominal pain, dysuria or urinary frequency. Patient has a past medical history of COVID-19 infection about 2 months ago.  She states that she has taken an at home Covid test which was negative.  She is fully vaccinated for COVID-19.  Patient's last menstrual period was about 2 months ago, but she says she has PCOS and has irregular menstrual periods.  She is sexually active.  She has been taking over-the-counter Mucinex.  There are no other concerns today.  HPI  Past Medical History:  Diagnosis Date  . Back pain   . Migraines   . 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 Headache disorder 05/14/2019    Past Surgical History:  Procedure Laterality Date  . CESAREAN SECTION     x 2  . ULNA OSTEOTOMY Right 12/21/2013   Procedure: OPEN ULNAR SHORTENING OSTEOTOMY RIGHT ;  Surgeon: Nicki Reaper, MD;  Location: [facility];  Service: Orthopedics;  Laterality: Right;  . WRIST ARTHROSCOPY WITH FOVEAL TRIANGULAR FIBROCARTILAGE COMPLEX REPAIR Right 12/21/2013   Procedure: ARTHROSCOPY DEBRIDEMENT TRIANGULAR CARTILAGE COMPLEX RIGHT WRIST;  Surgeon: Nicki Reaper, MD;  Location: [facility];  Service: Orthopedics;  Laterality: Right;    OB History   No obstetric history on file.    Has 2 children  Home Medications    Prior to Admission medications    Medication Sig Start Date End Date Taking? Authorizing Provider  butalbital-acetaminophen-caffeine (FIORICET) 50-325-40 MG tablet Take 1 tablet by mouth every 4 (four) hours as needed.   Yes [provider]  Doxylamine-Pyridoxine (DICLEGIS) 10-10 MG TBEC Take 1 tablet by mouth 3 (three) times daily as needed for up to 10 days. 10/10/20 10/20/20 Yes Shirlee Latch, PA-C  nortriptyline (PAMELOR) 50 MG capsule Take 50 mg by mouth at bedtime. 06/23/20  Yes [provider]  propranolol (INDERAL) 20 MG tablet Take 20 mg by mouth in the morning and at bedtime. 06/23/20  Yes [provider]    Family History Family History  Problem Relation Age of Onset  . Healthy Mother   . Hypertension Father   . Healthy Brother   . Asthma Son   . Breast cancer Maternal Grandmother   . Hypertension Maternal Grandmother     Social History Social History   Tobacco Use  . Smoking status: Never Smoker  . Smokeless tobacco: Never Used  Vaping Use  . Vaping Use: Never used  Substance Use Topics  . Alcohol use: No  . Drug use: No     Allergies   Patient has no known allergies.   Review of Systems Review of Systems  Constitutional: Positive for chills and fatigue. Negative for diaphoresis and fever.  HENT: Negative for congestion, ear pain, rhinorrhea, sinus pressure, sinus pain and sore throat.  Respiratory: Positive for cough. Negative for shortness of breath.   Gastrointestinal: Positive for diarrhea ("loose"), nausea and vomiting. Negative for abdominal pain.  Musculoskeletal: Negative for arthralgias and myalgias.  Skin: Negative for rash.  Neurological: Negative for weakness and headaches.  Hematological: Negative for adenopathy.     Physical Exam Triage Vital Signs ED Triage Vitals  Enc Vitals Group     BP 10/10/20 1338 (!) 146/83     Pulse Rate 10/10/20 1338 75     Resp 10/10/20 1338 14     Temp 10/10/20 1338 98.4 F (36.9 C)     Temp Source 10/10/20  1338 Oral     SpO2 10/10/20 1338 100 %     Weight 10/10/20 1334 220 lb (99.8 kg)     Height 10/10/20 1334 5\' 4"  (1.626 m)     Head Circumference --      Peak Flow --      Pain Score 10/10/20 1333 0     Pain Loc --      Pain Edu? --      Excl. in GC? --    No data found.  Updated Vital Signs BP (!) 146/83 (BP Location: Left Arm)   Pulse 75   Temp 98.4 F (36.9 C) (Oral)   Resp 14   Ht 5\' 4"  (1.626 m)   Wt 220 lb (99.8 kg)   LMP 08/16/2020   SpO2 100%   BMI 37.76 kg/m      Physical Exam Vitals and nursing note reviewed.  Constitutional:      General: She is not in acute distress.    Appearance: Normal appearance. She is not ill-appearing or toxic-appearing.  HENT:     Head: Normocephalic and atraumatic.     Nose: Nose normal.     Mouth/Throat:     Mouth: Mucous membranes are moist.     Pharynx: Oropharynx is clear.  Eyes:     General: No scleral icterus.       Right eye: No discharge.        Left eye: No discharge.     Conjunctiva/sclera: Conjunctivae normal.  Cardiovascular:     Rate and Rhythm: Normal rate and regular rhythm.     Heart sounds: Normal heart sounds.  Pulmonary:     Effort: Pulmonary effort is normal. No respiratory distress.     Breath sounds: Normal breath sounds.  Abdominal:     Palpations: Abdomen is soft.     Tenderness: There is no abdominal tenderness.  Musculoskeletal:     Cervical back: Neck supple.  Skin:    General: Skin is dry.  Neurological:     General: No focal deficit present.     Mental Status: She is alert. Mental status is at baseline.     Motor: No weakness.     Gait: Gait normal.  Psychiatric:        Mood and Affect: Mood normal.        Behavior: Behavior normal.        Thought Content: Thought content normal.      UC Treatments / Results  Labs (all labs ordered are listed, but only abnormal results are displayed) Labs Reviewed  PREGNANCY, URINE - Abnormal; Notable for the following components:      Result  Value   Preg Test, Ur POSITIVE (*)    All other components within normal limits  URINALYSIS, COMPLETE (UACMP) WITH MICROSCOPIC - Abnormal; Notable for the following components:   Color, Urine  BROWN (*)    APPearance HAZY (*)    Specific Gravity, Urine >1.030 (*)    Glucose, UA 100 (*)    Bilirubin Urine MODERATE (*)    Ketones, ur 80 (*)    Protein, ur 30 (*)    Leukocytes,Ua TRACE (*)    Bacteria, UA MANY (*)    All other components within normal limits    EKG   Radiology No results found.  Procedures Procedures (including critical care time)  Medications Ordered in UC Medications  ondansetron (ZOFRAN-ODT) disintegrating tablet 4 mg (4 mg Oral Given 10/10/20 1357)    Initial Impression / Assessment and Plan / UC Course  I have reviewed the triage vital signs and the nursing notes.  Pertinent labs & imaging results that were available during my care of the patient were reviewed by me and considered in my medical decision making (see chart for details).   40 year old female presenting for approximately 1 week history of fatigue, chills, N/V/D, and productive cough.  Patient with history of COVID-19 2 months ago.  Declines Covid testing today.  Blood pressure slightly elevated at 146/83 in the clinic.  Remainder of vital signs are normal.  Exam is completely benign.  Urine pregnancy test obtained. + pregnancy test. Dicussed results with patient. Advised to follow up with OB/GYN, health dept or Planned Pregnancy.   Patient informed nursing staff that she could not wait for UA results since she has to pick her children up and asked for a call. Declined AVS.   Advised supportive care with increasing rest and fluids.  Sent Diclegis for nausea and vomiting.  Tylenol for discomfort if needed.  She is advised to follow-up with our department or PCP if not getting better over the next week or for any worsening symptoms.  ED precautions reviewed.  Work note provided.  *Called patient  to inform of abnormal UA findings.  UA positive for brown urine and greater than 1.030 specific gravity, 100 glucose, moderate bilirubin, ketones and protein and trace leukocytes.  Advised patient that since she has a positive pregnancy test and does not know how far along she is with these abnormal urine test findings I advised her to have a reexamination as soon as possible in the emergency department.  Advised her that she needs blood work and monitoring.  Patient is agreeable.  Plans to go to ED after she picks up her children.   Final Clinical Impressions(s) / UC Diagnoses   Final diagnoses:  Nausea vomiting and diarrhea  Cough  Positive pregnancy test  Gestational proteinuria, antepartum  Glucosuria     Discharge Instructions     I sent likely just to pharmacy for your nausea and vomiting.  Based on the urinalysis findings, there is a lot of sugar in the urine and protein in their.  Also bilirubin.  Given these findings and positive pregnancy test today need to be evaluated in the emergency department setting today.    ED Prescriptions    Medication Sig Dispense Auth. Provider   Doxylamine-Pyridoxine (DICLEGIS) 10-10 MG TBEC Take 1 tablet by mouth 3 (three) times daily as needed for up to 10 days. 30 tablet Gareth Morgan     PDMP not reviewed this encounter.   Shirlee Latch, PA-C 10/10/20 1457

## 2020-10-10 NOTE — ED Triage Notes (Addendum)
Patient c/o cough, congestion and chills that started last week.  Patient states that she started vomiting this week.  Patient denies fevers.  Patient states that she was diagnosed with COVID at the beginning of Jan 2022.  Patient states that she took a home COVID test and was negative.

## 2020-10-10 NOTE — Discharge Instructions (Addendum)
I sent likely just to pharmacy for your nausea and vomiting.  Based on the urinalysis findings, there is a lot of sugar in the urine and protein in their.  Also bilirubin.  Given these findings and positive pregnancy test today need to be evaluated in the emergency department setting today.

## 2020-10-11 ENCOUNTER — Other Ambulatory Visit: Payer: Self-pay

## 2020-10-11 ENCOUNTER — Emergency Department: Payer: BC Managed Care – PPO

## 2020-10-11 ENCOUNTER — Emergency Department
Admission: EM | Admit: 2020-10-11 | Discharge: 2020-10-11 | Disposition: A | Discharge status: Home / Self Care | Payer: BC Managed Care – PPO | Attending: Emergency Medicine | Admitting: Emergency Medicine

## 2020-10-11 ENCOUNTER — Encounter: Payer: Self-pay | Admitting: Emergency Medicine

## 2020-10-11 DIAGNOSIS — O219 Vomiting of pregnancy, unspecified: Secondary | ICD-10-CM | POA: Insufficient documentation

## 2020-10-11 DIAGNOSIS — N390 Urinary tract infection, site not specified: Secondary | ICD-10-CM

## 2020-10-11 DIAGNOSIS — Z3A09 9 weeks gestation of pregnancy: Secondary | ICD-10-CM | POA: Diagnosis not present

## 2020-10-11 DIAGNOSIS — E860 Dehydration: Secondary | ICD-10-CM

## 2020-10-11 LAB — CHLAMYDIA/NGC RT PCR (ARMC ONLY)
Chlamydia Tr: NOT DETECTED
N gonorrhoeae: NOT DETECTED

## 2020-10-11 LAB — URINALYSIS, COMPLETE (UACMP) WITH MICROSCOPIC
Glucose, UA: NEGATIVE mg/dL
Hgb urine dipstick: NEGATIVE
Ketones, ur: 80 mg/dL — AB
Nitrite: NEGATIVE
Protein, ur: 30 mg/dL — AB
Specific Gravity, Urine: 1.035 — ABNORMAL HIGH (ref 1.005–1.030)
WBC, UA: >50 WBC/hpf — ABNORMAL HIGH (ref 0–5)
pH: 5 (ref 5.0–8.0)

## 2020-10-11 LAB — CBC
HCT: 37.2 % (ref 36.0–46.0)
Hemoglobin: 12.7 g/dL (ref 12.0–15.0)
MCH: 32.1 pg (ref 26.0–34.0)
MCHC: 34.1 g/dL (ref 30.0–36.0)
MCV: 93.9 fL (ref 80.0–100.0)
Platelets: 268 10*3/uL (ref 150–400)
RBC: 3.96 MIL/uL (ref 3.87–5.11)
RDW: 12.5 % (ref 11.5–15.5)
WBC: 9.1 10*3/uL (ref 4.0–10.5)
nRBC: 0 % (ref 0.0–0.2)

## 2020-10-11 LAB — COMPREHENSIVE METABOLIC PANEL
ALT: 86 U/L — ABNORMAL HIGH (ref 0–44)
AST: 46 U/L — ABNORMAL HIGH (ref 15–41)
Albumin: 4.1 g/dL (ref 3.5–5.0)
Alkaline Phosphatase: 92 U/L (ref 38–126)
Anion gap: 9 (ref 5–15)
BUN: 12 mg/dL (ref 6–20)
CO2: 22 mmol/L (ref 22–32)
Calcium: 9.3 mg/dL (ref 8.9–10.3)
Chloride: 106 mmol/L (ref 98–111)
Creatinine, Ser: 0.67 mg/dL (ref 0.44–1.00)
GFR, Estimated: >60 mL/min (ref >60)
Glucose, Bld: 105 mg/dL — ABNORMAL HIGH (ref 70–99)
Potassium: 3.5 mmol/L (ref 3.5–5.1)
Sodium: 137 mmol/L (ref 135–145)
Total Bilirubin: 1.4 mg/dL — ABNORMAL HIGH (ref 0.3–1.2)
Total Protein: 8.3 g/dL — ABNORMAL HIGH (ref 6.5–8.1)

## 2020-10-11 LAB — HCG, QUANTITATIVE, PREGNANCY: hCG, Beta Chain, Quant, S: 185964 m[IU]/mL — ABNORMAL HIGH (ref <5)

## 2020-10-11 MED ORDER — SODIUM CHLORIDE 0.9 % IV BOLUS
1000.0000 mL | Freq: Once | INTRAVENOUS | Status: AC
  Administered 2020-10-11: 1000 mL via INTRAVENOUS

## 2020-10-11 MED ORDER — CEPHALEXIN 500 MG PO CAPS
500.0000 mg | ORAL_CAPSULE | Freq: Once | ORAL | Status: AC
  Administered 2020-10-11: 500 mg via ORAL
  Filled 2020-10-11: qty 1

## 2020-10-11 MED ORDER — ONDANSETRON 4 MG PO TBDP
4.0000 mg | ORAL_TABLET | Freq: Three times a day (TID) | ORAL | 0 refills | Status: DC | PRN
Start: 2020-10-11 — End: 2021-09-14

## 2020-10-11 MED ORDER — ONDANSETRON HCL 4 MG/2ML IJ SOLN
4.0000 mg | Freq: Once | INTRAMUSCULAR | Status: DC
  Filled 2020-10-11: qty 2

## 2020-10-11 MED ORDER — CEPHALEXIN 500 MG PO CAPS
500.0000 mg | ORAL_CAPSULE | Freq: Two times a day (BID) | ORAL | 0 refills | Status: DC
Start: 2020-10-11 — End: 2021-09-14

## 2020-10-11 NOTE — Discharge Instructions (Signed)
As we discussed your work-up today shows you are pregnant approximately 8 weeks and 2 days.  You appear dehydrated please increase your fluids, you have been prescribed a nausea medication that you may use if needed as prescribed.  Please finish your course of antibiotics.  Return to the emergency department for any abdominal pain, vaginal bleeding, or any other symptom personally concerning to yourself.  Otherwise as we discussed please call OB/GYN on Monday to arrange a follow-up appointment as soon as possible.

## 2020-10-11 NOTE — ED Notes (Signed)
IVF applied to pump, dirty UA sent to lab at this time.

## 2020-10-11 NOTE — ED Notes (Signed)
Spoke with Dr. Cyril Loosen, MD regarding pt at this time. See new orders.

## 2020-10-11 NOTE — ED Triage Notes (Addendum)
Pt via POV from home. Pt was seen at [facility] yesterday and they called her saying she had protein, glucose, and bilirubin in her urine. Pt states that she went to UC for NV and generalized weakness that has been going on for about a week. Pt also found out she was pregnant, does know how far along she is. Pt is A&Ox4 and NAD. Denies pain.

## 2020-10-11 NOTE — ED Notes (Signed)
Pt educated on dirty urine specimen collection and provided UA cup.

## 2020-10-11 NOTE — ED Notes (Signed)
Pt denies nausea at this time- zofran held. Pt states she has been coughing up mucus

## 2020-10-11 NOTE — ED Notes (Signed)
Pt with US. 

## 2020-10-11 NOTE — ED Provider Notes (Signed)
[facility] Emergency Department Provider Note  Time seen: 3:34 PM  I have reviewed the triage vital signs and the nursing notes.   HISTORY  Chief Complaint Abnormal Lab    HPI Martha Grant is a 40 y.o. female with no significant past medical history presents to the emergency department after recently finding out she was pregnant for generalized fatigue and nausea.  According to the patient for the past week or so she has been feeling nauseated and fatigued.  States she is vomiting at times.  Went to urgent care last night and had a urinalysis showing possible infection as well as several other findings in the urine and was told she was pregnant.  Patient states her last menstrual period was sometime in January.  Denies any abdominal pain or cramping.  Denies any vaginal bleeding or discharge.  Denies dysuria but does state dark urine.  No fever cough or shortness of breath.   Past Medical History:  Diagnosis Date  . Back pain   . Migraines   . 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 Headache disorder 05/14/2019    Past Surgical History:  Procedure Laterality Date  . CESAREAN SECTION     x 2  . ULNA OSTEOTOMY Right 12/21/2013   Procedure: OPEN ULNAR SHORTENING OSTEOTOMY RIGHT ;  Surgeon: Nicki Reaper, MD;  Location: [facility];  Service: Orthopedics;  Laterality: Right;  . WRIST ARTHROSCOPY WITH FOVEAL TRIANGULAR FIBROCARTILAGE COMPLEX REPAIR Right 12/21/2013   Procedure: ARTHROSCOPY DEBRIDEMENT TRIANGULAR CARTILAGE COMPLEX RIGHT WRIST;  Surgeon: Nicki Reaper, MD;  Location: [facility];  Service: Orthopedics;  Laterality: Right;    Prior to Admission medications   Medication Sig Start Date End Date Taking? Authorizing Provider  butalbital-acetaminophen-caffeine (FIORICET) 50-325-40 MG tablet Take 1 tablet by mouth every 4 (four) hours as needed.    [provider]  Doxylamine-Pyridoxine (DICLEGIS) 10-10 MG TBEC Take 1 tablet by mouth 3 (three) times daily as needed for up to 10 days. 10/10/20 10/20/20  Shirlee Latch, PA-C  nortriptyline (PAMELOR) 50 MG capsule Take 50 mg by mouth at bedtime. 06/23/20   [provider]  propranolol (INDERAL) 20 MG tablet Take 20 mg by mouth in the morning and at bedtime. 06/23/20   [provider]    No Known Allergies  Family History  Problem Relation Age of Onset  . Healthy Mother   . Hypertension Father   . Healthy Brother   . Asthma Son   . Breast cancer Maternal Grandmother   . Hypertension Maternal Grandmother     Social History Social History   Tobacco Use  . Smoking status: Never Smoker  . Smokeless tobacco: Never Used  Vaping Use  . Vaping Use: Never used  Substance Use Topics  . Alcohol use: No  . Drug use: No    Review of Systems Constitutional: Negative for fever. Cardiovascular: Negative for chest pain. Respiratory: Negative for shortness of breath. Gastrointestinal: Negative for abdominal pain.  Positive for nausea and occasional vomiting. Genitourinary: Dark urine Musculoskeletal: Negative for musculoskeletal complaints Neurological: Negative for headache All other ROS negative  ____________________________________________   PHYSICAL EXAM:  VITAL SIGNS: ED Triage Vitals  Enc Vitals Group     BP 10/11/20 1226 (!) 145/85     Pulse Rate 10/11/20 1226 70     Resp 10/11/20 1226 20  Temp 10/11/20 1226 98.5 F (36.9 C)     Temp Source 10/11/20 1226 Oral     SpO2 10/11/20 1226 98 %     Weight 10/11/20 1227 220 lb (99.8 kg)     Height 10/11/20 1227 5\' 4"  (1.626 m)     Head Circumference --      Peak Flow --      Pain Score 10/11/20 1226 0     Pain Loc --      Pain Edu? --      Excl. in GC? --    Constitutional: Alert and oriented. Well appearing and in no distress. Eyes: Normal exam ENT      Head: Normocephalic and atraumatic.       Mouth/Throat: Mucous membranes are moist. Cardiovascular: Normal rate, regular rhythm Respiratory: Normal respiratory effort without tachypnea nor retractions. Breath sounds are clear  Gastrointestinal: Soft and nontender. No distention.   Musculoskeletal: Nontender with normal range of motion in all extremities.  Neurologic:  Normal speech and language. No gross focal neurologic deficits  Skin:  Skin is warm, dry and intact.  Psychiatric: Mood and affect are normal.   ____________________________________________   RADIOLOGY  Ultrasound consistent with 8-week 2-day fetus with a heartbeat.  ____________________________________________   INITIAL IMPRESSION / ASSESSMENT AND PLAN / ED COURSE  Pertinent labs & imaging results that were available during my care of the patient were reviewed by me and considered in my medical decision making (see chart for details).   Patient presents emergency department with complaints of nausea fatigue over the past week or so.  Went to urgent care and found out she is pregnant yesterday.  LMP in January per patient.  Patient states dark urine but denies dysuria.  Patient's urinalysis does show white cells along with a small amount of bilirubin, ketones and a small amount of protein.  Lab work however is more reassuring with a slight LFT elevation, normal platelets, normal hemoglobin.  Beta hCG of 185,000.  We will send urine culture and treat with antibiotics as a precaution.  Lab work overall appears to be more consistent with dehydration.  We will IV hydrate and obtain an ultrasound, as the patient has had no prenatal care as of yet.  Patient agreeable to plan of care.  Ultrasound consistent with 8-week 2-day fetus with a heartbeat.  Discussed with the patient the importance of following up with OB/GYN as soon as possible.  Patient is feeling better after hydration.  We will discharge with a short course of Zofran to be used only if needed.  Discussed the pros  and cons of Zofran use during pregnancy.  Patient agreeable to plan of care.  Kynnadi Dicenso Pong was evaluated in Emergency Department on 10/11/2020 for the symptoms described in the history of present illness. She was evaluated in the context of the global COVID-19 pandemic, which necessitated consideration that the patient might be at risk for infection with the SARS-CoV-2 virus that causes COVID-19. Institutional protocols and algorithms that pertain to the evaluation of patients at risk for COVID-19 are in a state of rapid change based on information released by regulatory bodies including the CDC and federal and state organizations. These policies and algorithms were followed during the patient's care in the ED.  ____________________________________________   FINAL CLINICAL IMPRESSION(S) / ED DIAGNOSES  Nausea vomiting Pregnancy   12/11/2020, MD 10/11/20 1655

## 2020-10-13 LAB — URINE CULTURE

## 2020-10-23 ENCOUNTER — Ambulatory Visit (LOCAL_COMMUNITY_HEALTH_CENTER): Payer: Self-pay

## 2020-10-23 ENCOUNTER — Other Ambulatory Visit: Payer: Self-pay

## 2020-10-23 VITALS — BP 139/94 | Ht 64.0 in | Wt 237.5 lb

## 2020-10-23 DIAGNOSIS — Z3201 Encounter for pregnancy test, result positive: Secondary | ICD-10-CM

## 2020-10-23 LAB — PREGNANCY, URINE: Preg Test, Ur: POSITIVE — AB

## 2020-10-23 MED ORDER — PRENATAL 27-0.8 MG PO TABS: 1.0000 | ORAL_TABLET | Freq: Every day | ORAL | 0 refills | Status: AC

## 2020-10-23 NOTE — Progress Notes (Signed)
UPT positive. BP elevated today at 139/94.Plans prenatal care at [facility]. Pt on meds prescribed by [facility] Elveria Rising, FNP who advises RN to counsel  pt to call prescribing provider today to notify them of pregnancy and to advise on any adjustments to medications. Also advises RN to counsel pt to establish prenatal care ASAP. RN carried out provider orders. Pt in agreement and plans to call providers today. Sent to DSS for Medicaid/[employer]. Jerel Shepherd, RN

## 2020-10-23 NOTE — Progress Notes (Signed)
Consulted by RN re: patient situation.  Reviewed RN note and agree that it reflects our discussion and my recommendations.  ° ° °Isabeau Mccalla, FNP  °

## 2021-03-13 IMAGING — US US OB COMP LESS 14 WK
1 series · 14 of 28 positions shown · non-contrast
Comparison: None.

CLINICAL DATA: 39-year-old pregnant female with fatigue and nausea.

EXAM:
OBSTETRIC <14 WK ULTRASOUND
TECHNIQUE: Transabdominal ultrasound was performed for evaluation of the
gestation as well as the maternal uterus and adnexal regions.

[Series 1: us ob comp less 14 wks · 14 of 56 slices shown]
[im 3/56]
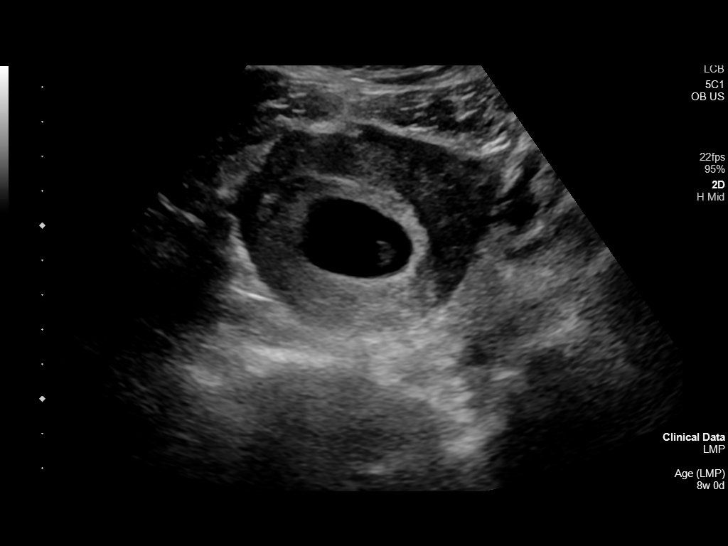
[im 7/56]
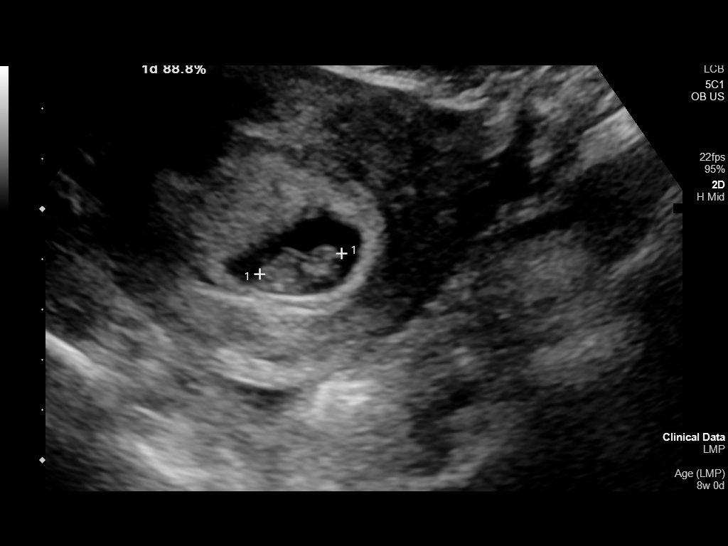
[im 11/56]
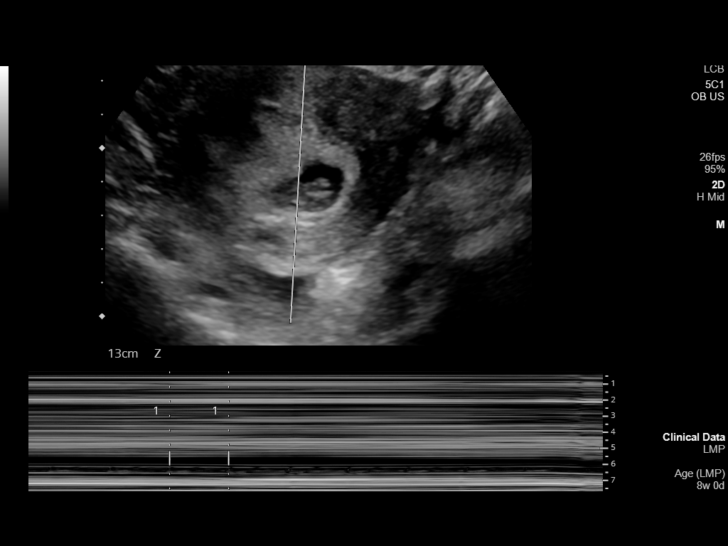
[im 15/56]
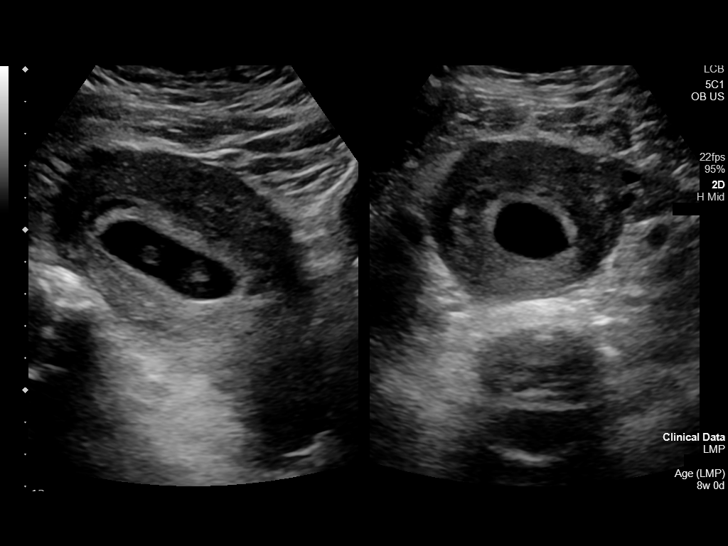
[im 19/56]
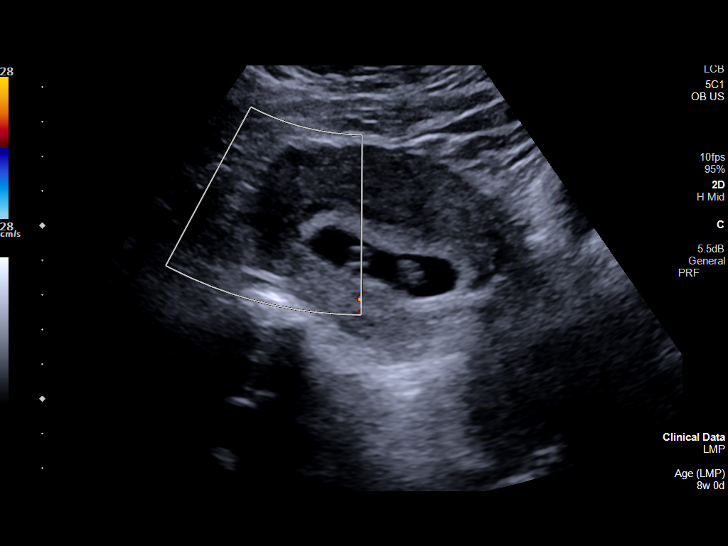
[im 23/56]
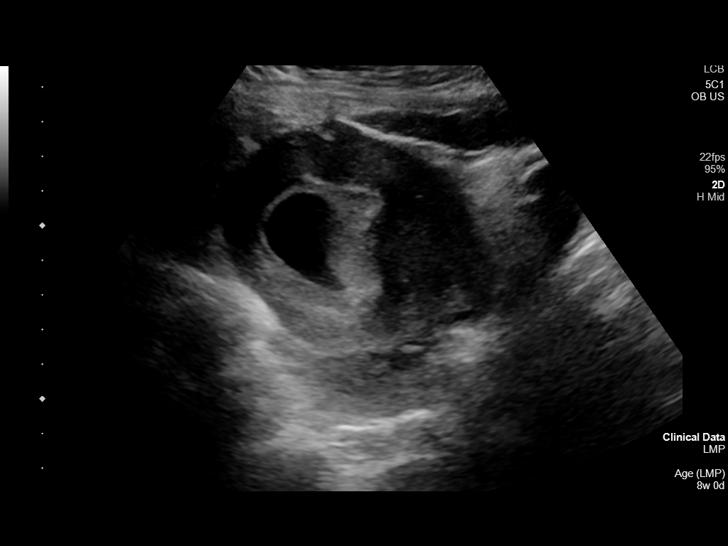
[im 27/56]
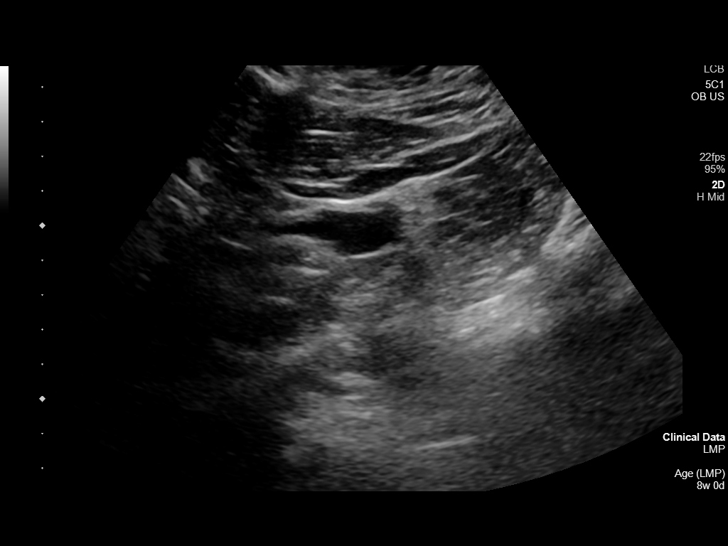
[im 31/56]
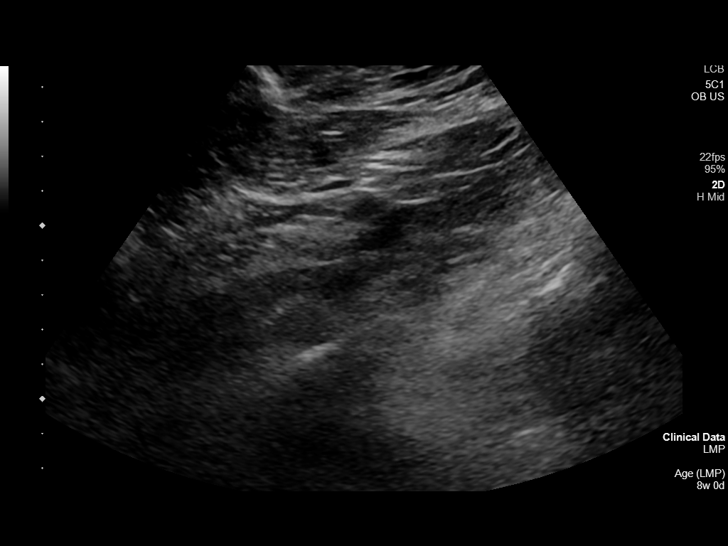
[im 35/56]
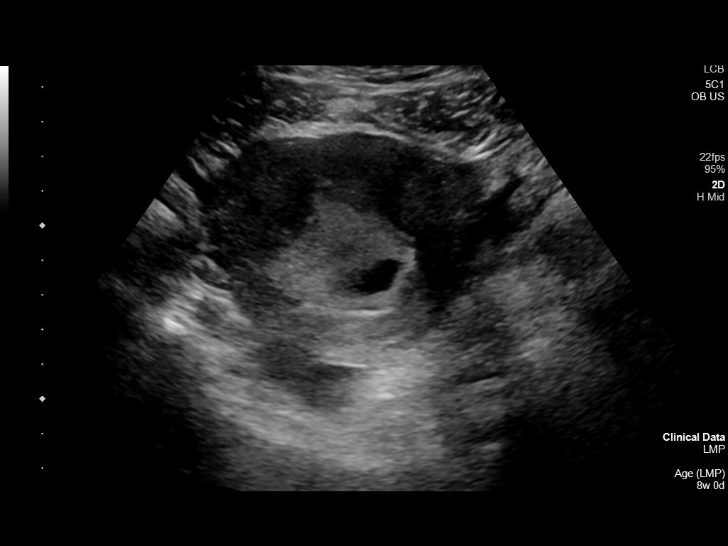
[im 39/56]
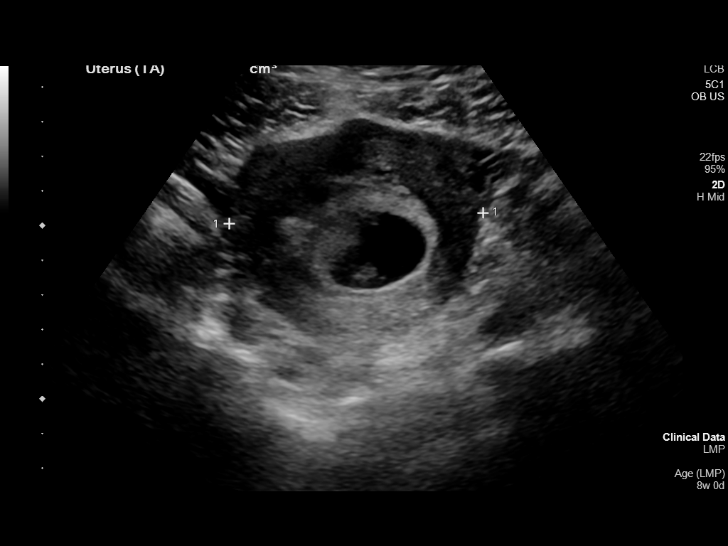
[im 43/56]
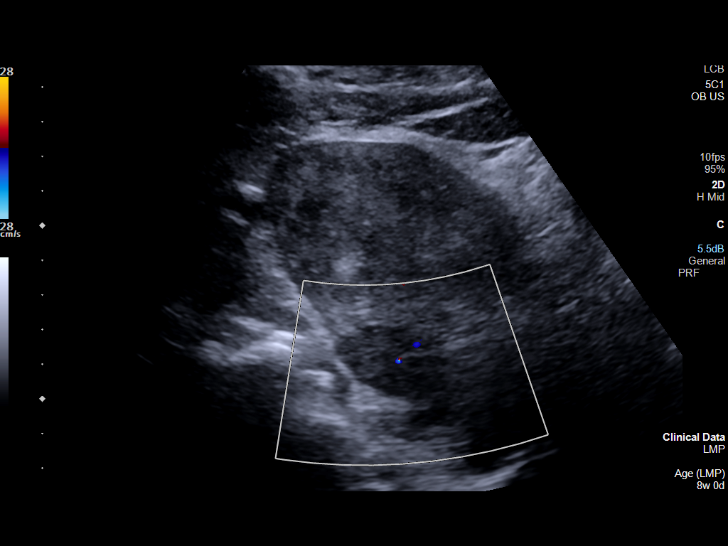
[im 47/56]
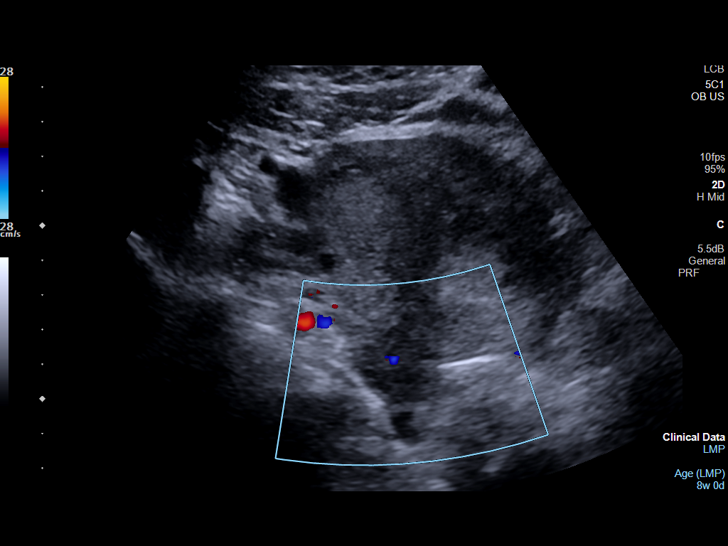
[im 51/56]
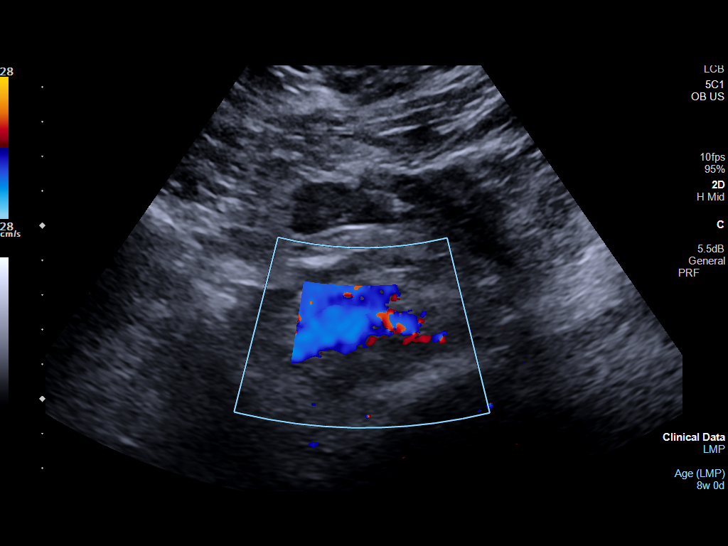
[im 56/56]
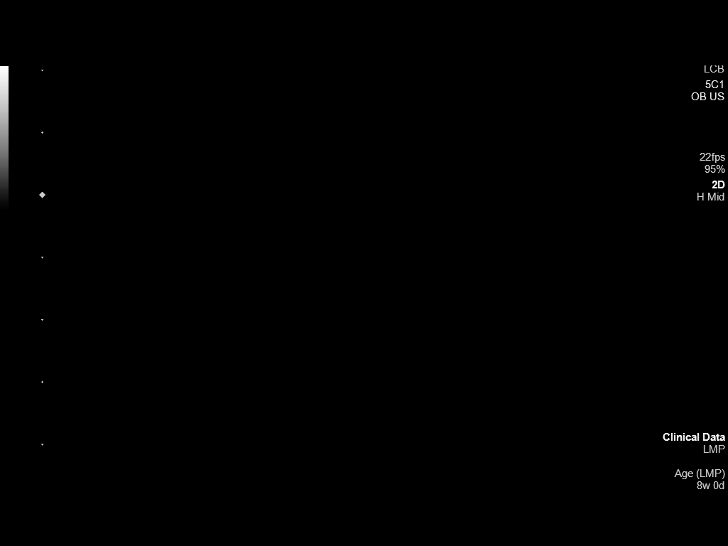

[14 of 28 positions shown; findings below may reference images not displayed]

FINDINGS: Intrauterine gestational sac: Single

Yolk sac:  Visualized.

Embryo:  Visualized.

Cardiac Activity: Visualized.

Heart Rate: 173 bpm

CRL:   17.5 mm   8 w 2 d                  US EDC: 05/21/2021

Subchorionic hemorrhage: A small to moderate subchorionic hemorrhage
is noted.

Maternal uterus/adnexae: A 1.5 cm fundal fibroid and a 1.4 cm
uterine body fibroid are noted.

The ovaries bilaterally are unremarkable.

There is no evidence of free fluid.
IMPRESSION: 1. Single living intrauterine gestation with gestational age of 8
weeks 2 days by this ultrasound.
2. Small to moderate subchorionic hemorrhage.
3. Two uterine fibroids.
4. Unremarkable ovaries.

## 2021-09-09 ENCOUNTER — Encounter: Payer: Self-pay | Admitting: Family Medicine

## 2021-09-14 ENCOUNTER — Encounter: Payer: Self-pay | Admitting: Internal Medicine

## 2021-09-14 ENCOUNTER — Ambulatory Visit: Payer: BC Managed Care – PPO | Admitting: Internal Medicine

## 2021-09-14 VITALS — BP 130/84 | HR 82 | Temp 98.0°F | Resp 16 | Ht 64.0 in | Wt 243.8 lb

## 2021-09-14 DIAGNOSIS — Z114 Encounter for screening for human immunodeficiency virus [HIV]: Secondary | ICD-10-CM

## 2021-09-14 DIAGNOSIS — Z13220 Encounter for screening for lipoid disorders: Secondary | ICD-10-CM | POA: Diagnosis not present

## 2021-09-14 DIAGNOSIS — Z6841 Body Mass Index (BMI) 40.0 and over, adult: Secondary | ICD-10-CM

## 2021-09-14 DIAGNOSIS — Z23 Encounter for immunization: Secondary | ICD-10-CM

## 2021-09-14 LAB — ADVANCED WRITTEN NOTIFICATION (AWN) TEST REFUSAL: AWN TEST REFUSED: 7600

## 2021-09-14 LAB — HIV ANTIBODY (ROUTINE TESTING W REFLEX): HIV 1&2 Ab, 4th Generation: NONREACTIVE

## 2021-09-14 LAB — TSH: TSH: 1.12 mIU/L

## 2021-09-14 NOTE — Progress Notes (Signed)
Acute Office Visit  Subjective:    Patient ID: Martha Grant, female    DOB: [birth year], 41 y.o.   MRN: [id number]  Chief Complaint  Patient presents with   Referral    HPI Patient is in today for weight loss discussion. She has bene trying to lose weight for some time with diet and exercise but has not been successful. Her BMI here today is 41.85, she does not have any co-morbidities other than a PCOS diagnosis. Last A1c 5.6%. She has not tried any medications for weight loss.   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    Past Surgical History:  Procedure Laterality Date   CESAREAN SECTION     x 2   ULNA OSTEOTOMY Right 12/21/2013   Procedure: OPEN ULNAR SHORTENING OSTEOTOMY RIGHT ;  Surgeon: Wynonia Sours, MD;  Location: [place];  Service: Orthopedics;  Laterality: Right;   WRIST ARTHROSCOPY WITH FOVEAL TRIANGULAR FIBROCARTILAGE COMPLEX REPAIR Right 12/21/2013   Procedure: ARTHROSCOPY DEBRIDEMENT TRIANGULAR CARTILAGE COMPLEX RIGHT WRIST;  Surgeon: Wynonia Sours, MD;  Location: [place];  Service: Orthopedics;  Laterality: Right;    Family History  Problem Relation Age of Onset   Healthy Mother    Hypertension Father    Healthy Brother    Asthma Son    Breast cancer Maternal Grandmother    Hypertension Maternal Grandmother     Social History   Socioeconomic History   Marital status: Significant Other    Spouse name: Not on file   Number of children: 1   Years of education: 12   Highest education level: Associate degree: academic program  Occupational History   Not on file  Tobacco Use   Smoking status: Never   Smokeless tobacco: Never  Vaping Use   Vaping Use: Never used  Substance and Sexual Activity   Alcohol  use: No   Drug use: No   Sexual activity: Yes    Birth control/protection: None    Comment: hx depo and ocp, stopped ocp "years ago"  Other Topics Concern   Not on file  Social History Narrative   Not on file   Social Determinants of Health   Financial Resource Strain: Not on file  Food Insecurity: Not on file  Transportation Needs: Not on file  Physical Activity: Not on file  Stress: Not on file  Social Connections: Not on file  Intimate Partner Violence: Not At Risk   Fear of Current or Ex-Partner: No   Emotionally Abused: No   Physically Abused: No   Sexually Abused: No    Outpatient Medications Prior to Visit  Medication Sig Dispense Refill   butalbital-acetaminophen-caffeine (FIORICET) 50-325-40 MG tablet Take 1 tablet by mouth every 4 (four) hours as needed.     cephALEXin (KEFLEX) 500 MG capsule Take 1 capsule (500 mg total) by mouth 2 (two) times daily. 14 capsule 0   nortriptyline (PAMELOR) 50 MG capsule Take 50 mg by mouth at bedtime.     ondansetron (ZOFRAN ODT) 4 MG disintegrating tablet Take 1 tablet (4 mg total) by mouth every 8 (eight) hours as needed for nausea or vomiting. 20 tablet 0   propranolol (INDERAL) 20 MG tablet Take 20 mg by mouth  in the morning and at bedtime.     No facility-administered medications prior to visit.    No Known Allergies  Review of Systems  Constitutional:  Negative for activity change, appetite change, chills, fever and unexpected weight change.  Endocrine: Negative for cold intolerance, polydipsia and polyuria.      Objective:    Physical Exam Constitutional:      Appearance: Normal appearance. She is obese.  HENT:     Head: Normocephalic and atraumatic.  Eyes:     Conjunctiva/sclera: Conjunctivae normal.  Cardiovascular:     Rate and Rhythm: Normal rate and regular rhythm.  Pulmonary:     Effort: Pulmonary effort is normal.     Breath sounds: Normal breath sounds.  Musculoskeletal:     Right lower leg: No edema.      Left lower leg: No edema.  Skin:    General: Skin is warm and dry.  Neurological:     General: No focal deficit present.     Mental Status: She is alert. Mental status is at baseline.  Psychiatric:        Mood and Affect: Mood normal.        Behavior: Behavior normal.    BP 130/84    Pulse 82    Temp 98 F (36.7 C)    Resp 16    Ht 5\' 4"  (1.626 m)    Wt 243 lb 12.8 oz (110.6 kg)    LMP 09/05/2021    SpO2 98%    BMI 41.85 kg/m  Wt Readings from Last 3 Encounters:  09/14/21 243 lb 12.8 oz (110.6 kg)  10/23/20 237 lb 8 oz (107.7 kg)  10/11/20 220 lb (99.8 kg)    Health Maintenance Due  Topic Date Due   HIV Screening  Never done   COVID-19 Vaccine (3 - Booster for Pfizer series) 06/19/2020   TETANUS/TDAP  08/09/2020   INFLUENZA VACCINE  03/09/2021    There are no preventive care reminders to display for this patient.   Lab Results  Component Value Date   TSH 1.65 05/28/2019   Lab Results  Component Value Date   WBC 9.1 10/11/2020   HGB 12.7 10/11/2020   HCT 37.2 10/11/2020   MCV 93.9 10/11/2020   PLT 268 10/11/2020   Lab Results  Component Value Date   NA 137 10/11/2020   K 3.5 10/11/2020   CO2 22 10/11/2020   GLUCOSE 105 (H) 10/11/2020   BUN 12 10/11/2020   CREATININE 0.67 10/11/2020   BILITOT 1.4 (H) 10/11/2020   ALKPHOS 92 10/11/2020   AST 46 (H) 10/11/2020   ALT 86 (H) 10/11/2020   PROT 8.3 (H) 10/11/2020   ALBUMIN 4.1 10/11/2020   CALCIUM 9.3 10/11/2020   ANIONGAP 9 10/11/2020   Lab Results  Component Value Date   CHOL 163 05/28/2019   Lab Results  Component Value Date   HDL 49 (L) 05/28/2019   Lab Results  Component Value Date   LDLCALC 103 (H) 05/28/2019   Lab Results  Component Value Date   TRIG 36 05/28/2019   Lab Results  Component Value Date   CHOLHDL 3.3 05/28/2019   Lab Results  Component Value Date   HGBA1C 5.9 (H) 05/28/2019       Assessment & Plan:   1. Class 3 severe obesity without serious comorbidity with body  mass index (BMI) of 40.0 to 44.9 in adult, unspecified obesity type [facility]): Referral placed to weight management at patient's request, will  recheck lipid panel and TSH today as well.   - Amb Ref to Medical Weight Management - Lipid Profile - TSH  2. Lipid screening: Screening due.  - Lipid Profile  3. Encounter for screening for HIV: Screening due.  - HIV antibody (with reflex)  4. Need for Tdap vaccination: Tdap administered today.  - Tdap vaccine greater than or equal to 7yo IM   Teodora Medici, DO

## 2021-10-09 ENCOUNTER — Ambulatory Visit: Payer: Self-pay | Admitting: Nurse Practitioner

## 2021-10-15 ENCOUNTER — Encounter: Payer: Self-pay | Admitting: Emergency Medicine

## 2021-10-15 ENCOUNTER — Other Ambulatory Visit: Payer: Self-pay

## 2021-10-15 ENCOUNTER — Ambulatory Visit
Admission: EM | Admit: 2021-10-15 | Discharge: 2021-10-15 | Disposition: A | Discharge status: Home / Self Care | Payer: BC Managed Care – PPO | Attending: Physician Assistant | Admitting: Physician Assistant

## 2021-10-15 DIAGNOSIS — M546 Pain in thoracic spine: Secondary | ICD-10-CM

## 2021-10-15 MED ORDER — NAPROXEN 500 MG PO TABS: 500.0000 mg | ORAL_TABLET | Freq: Two times a day (BID) | ORAL | 0 refills | Status: DC | PRN

## 2021-10-15 MED ORDER — CYCLOBENZAPRINE HCL 10 MG PO TABS: 10.0000 mg | ORAL_TABLET | Freq: Three times a day (TID) | ORAL | 0 refills | Status: AC | PRN
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
## 2021-10-15 NOTE — Discharge Instructions (Signed)
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
## 2021-10-22 ENCOUNTER — Ambulatory Visit
Admission: EM | Admit: 2021-10-22 | Discharge: 2021-10-22 | Disposition: A | Discharge status: Home / Self Care | Payer: BC Managed Care – PPO | Attending: Emergency Medicine | Admitting: Emergency Medicine

## 2021-10-22 ENCOUNTER — Other Ambulatory Visit: Payer: Self-pay

## 2021-10-22 DIAGNOSIS — S161XXD Strain of muscle, fascia and tendon at neck level, subsequent encounter: Secondary | ICD-10-CM

## 2021-10-22 DIAGNOSIS — S29019D Strain of muscle and tendon of unspecified wall of thorax, subsequent encounter: Secondary | ICD-10-CM

## 2021-10-22 DIAGNOSIS — R519 Headache, unspecified: Secondary | ICD-10-CM

## 2021-10-22 DIAGNOSIS — S63502A Unspecified sprain of left wrist, initial encounter: Secondary | ICD-10-CM

## 2021-10-22 MED ORDER — PREDNISONE 10 MG (21) PO TBPK: ORAL_TABLET | ORAL | 0 refills | Status: DC

## 2021-10-22 MED ORDER — BACLOFEN 10 MG PO TABS: 10.0000 mg | ORAL_TABLET | Freq: Three times a day (TID) | ORAL | 0 refills | Status: DC
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
## 2021-10-22 NOTE — ED Triage Notes (Signed)
Pt c/o continued back pain, headache, wrist pain, neck pain, dizziness since last visit ?

## 2021-10-22 NOTE — Discharge Instructions (Addendum)
Wear the wrist splint for the next week at all times but she can take it off to wash her hands and to bathe. ? ?Follow the wrist rehab exercises and before then once to twice a day to help maintain mobility in your wrist. ? ?Start taking the prednisone according to the package instructions tomorrow morning with breakfast.  You will take it each morning at breakfast time for period of 6 days. ? ?You may start using the baclofen tonight and you can take it every 8 hours to help with muscle spasm.  I would recommend taking it on a schedule for the next 48 hours and then on as-needed basis. ? ? ?

## 2022-08-10 ENCOUNTER — Ambulatory Visit: Payer: BC Managed Care – PPO | Admitting: Internal Medicine

## 2022-09-27 NOTE — Progress Notes (Unsigned)
Name: Martha Grant   MRN: [id number]    DOB: [birth year]   Date:09/27/2022       Progress Note  Subjective  Chief Complaint  No chief complaint on file.   HPI  Patient presents for annual CPE.  Diet: *** Exercise: ***  Last Eye Exam: *** Last Dental Exam: ***  Flowsheet Row Office Visit from 07/11/2020 in [facility]  AUDIT-C Score 0      Depression: Phq 9 is  {Desc; negative/positive:13464}    09/14/2021    9:33 AM 10/23/2020   11:36 AM 07/11/2020    1:07 PM 05/28/2019   10:51 AM 04/27/2019    2:09 PM  Depression screen PHQ 2/9  Decreased Interest 0 0 0 0 0  Down, Depressed, Hopeless 0 0 0 0 0  PHQ - 2 Score 0 0 0 0 0  Altered sleeping 0   0 0  Tired, decreased energy 0   0 0  Change in appetite 0   0 0  Feeling bad or failure about yourself  0   0 0  Trouble concentrating 0   0 0  Moving slowly or fidgety/restless 0   0 0  Suicidal thoughts 0   0 0  PHQ-9 Score 0   0 0  Difficult doing work/chores Not difficult at all   Not difficult at all Not difficult at all   Hypertension: BP Readings from Last 3 Encounters:  10/22/21 (!) 153/102  10/15/21 (!) 159/105  09/14/21 130/84   Obesity: Wt Readings from Last 3 Encounters:  10/22/21 241 lb (109.3 kg)  10/15/21 243 lb 13.3 oz (110.6 kg)  09/14/21 243 lb 12.8 oz (110.6 kg)   BMI Readings from Last 3 Encounters:  10/22/21 41.37 kg/m  10/15/21 41.85 kg/m  09/14/21 41.85 kg/m     Vaccines:   Tdap: UTD Shingrix: n/a Pneumonia: n/a Flu: 2022 COVID-19: yes   Hep C Screening: 2022 STD testing and prevention (HIV/chl/gon/syphilis):  Intimate partner violence: {Desc; negative/positive:13464} screen  Sexual History : Menstrual History/LMP/Abnormal Bleeding:  Discussed importance of follow up if any post-menopausal bleeding: {Response; yes/no/na:63}  Incontinence Symptoms: {Desc; negative/positive:13464} for symptoms   Breast cancer:  - Last Mammogram: 2019 - had area of concern  at the time  Osteoporosis Prevention : Discussed high calcium and vitamin D supplementation, weight bearing exercises Bone density :not applicable   Cervical cancer screening: Due   Skin cancer: Discussed monitoring for atypical lesions  Colorectal cancer: Due at age 46, discussed    Lung cancer:  Low Dose CT Chest recommended if Age 98-80 years, 20 pack-year currently smoking OR have quit w/in 15years. Patient does not qualify for screen    Advanced Care Planning: A voluntary discussion about advance care planning including the explanation and discussion of advance directives.  Discussed health care proxy and Living will, and the patient was able to identify a health care proxy as ***.  Patient {DOES_DOES [id number] have a living will and power of attorney of health care   Lipids: Lab Results  Component Value Date   CHOL 163 05/28/2019   Lab Results  Component Value Date   HDL 49 (L) 05/28/2019   Lab Results  Component Value Date   LDLCALC 103 (H) 05/28/2019   Lab Results  Component Value Date   TRIG 36 05/28/2019   Lab Results  Component Value Date   CHOLHDL 3.3 05/28/2019   No results found for: "LDLDIRECT"  Glucose: Glucose, Bld  Date  Value Ref Range Status  10/11/2020 105 (H) 70 - 99 mg/dL Final    Comment:    Glucose reference range applies only to samples taken after fasting for at least 8 hours.  07/11/2020 88 65 - 99 mg/dL Final    Comment:    .            Fasting reference interval .   05/28/2019 93 65 - 99 mg/dL Final    Comment:    .            Fasting reference interval .     Patient Active Problem List   Diagnosis Date Noted   Class 3 severe obesity without serious comorbidity with body mass index (BMI) of 40.0 to 44.9 in adult [facility]) 09/14/2021   Headache disorder 05/14/2019    Past Surgical History:  Procedure Laterality Date   CESAREAN SECTION     x 2   LAPAROSCOPIC BILATERAL SALPINGO OOPHERECTOMY Bilateral    ULNA OSTEOTOMY Right  12/21/2013   Procedure: OPEN ULNAR SHORTENING OSTEOTOMY RIGHT ;  Surgeon: Wynonia Sours, MD;  Location: [place];  Service: Orthopedics;  Laterality: Right;   WRIST ARTHROSCOPY WITH FOVEAL TRIANGULAR FIBROCARTILAGE COMPLEX REPAIR Right 12/21/2013   Procedure: ARTHROSCOPY DEBRIDEMENT TRIANGULAR CARTILAGE COMPLEX RIGHT WRIST;  Surgeon: Wynonia Sours, MD;  Location: [place];  Service: Orthopedics;  Laterality: Right;    Family History  Problem Relation Age of Onset   Healthy Mother    Hypertension Father    Healthy Brother    Asthma Son    Breast cancer Maternal Grandmother    Hypertension Maternal Grandmother     Social History   Socioeconomic History   Marital status: Significant Other    Spouse name: Not on file   Number of children: 1   Years of education: 12   Highest education level: Associate degree: academic program  Occupational History   Not on file  Tobacco Use   Smoking status: Never   Smokeless tobacco: Never  Vaping Use   Vaping Use: Never used  Substance and Sexual Activity   Alcohol use: No   Drug use: No   Sexual activity: Yes    Birth control/protection: None    Comment: hx depo and ocp, stopped ocp "years ago"  Other Topics Concern   Not on file  Social History Narrative   Not on file   Social Determinants of Health   Financial Resource Strain: Low Risk  (07/11/2020)   Overall Financial Resource Strain (CARDIA)    Difficulty of Paying Living Expenses: Not hard at all  Food Insecurity: No Food Insecurity (07/11/2020)   Hunger Vital Sign    Worried About Running Out of Food in the Last Year: Never true    [place] in the Last Year: Never true  Transportation Needs: No Transportation Needs (07/11/2020)   PRAPARE - Hydrologist (Medical): No    Lack of Transportation (Non-Medical): No  Physical Activity: Inactive (07/11/2020)   Exercise Vital Sign    Days of Exercise per Week: 0 days     Minutes of Exercise per Session: 0 min  Stress: No Stress Concern Present (07/11/2020)   [place]    Feeling of Stress : Not at all  Social Connections: Unknown (07/11/2020)   Social Connection and Isolation Panel [NHANES]    Frequency of Communication with Friends and Family: More than  three times a week    Frequency of Social Gatherings with Friends and Family: Three times a week    Attends Religious Services: Patient refused    Active Member of Clubs or Organizations: Patient refused    Attends Archivist Meetings: Patient refused    Marital Status: Patient refused  Intimate Partner Violence: Not At Risk (10/23/2020)   Humiliation, Afraid, Rape, and Kick questionnaire    Fear of Current or Ex-Partner: No    Emotionally Abused: No    Physically Abused: No    Sexually Abused: No     Current Outpatient Medications:    baclofen (LIORESAL) 10 MG tablet, Take 1 tablet (10 mg total) by mouth 3 (three) times daily., Disp: 30 each, Rfl: 0   butalbital-acetaminophen-caffeine (FIORICET) 50-325-40 MG tablet, Take 1 tablet by mouth every 4 (four) hours as needed., Disp: , Rfl:    nortriptyline (PAMELOR) 50 MG capsule, Take 50 mg by mouth at bedtime., Disp: , Rfl:    predniSONE (STERAPRED UNI-PAK 21 TAB) 10 MG (21) TBPK tablet, Take 6 tablets on day 1, 5 tablets day 2, 4 tablets day 3, 3 tablets day 4, 2 tablets day 5, 1 tablet day 6, Disp: 21 tablet, Rfl: 0   propranolol (INDERAL) 20 MG tablet, Take 20 mg by mouth in the morning and at bedtime., Disp: , Rfl:   No Known Allergies   ROS  ***  Objective  There were no vitals filed for this visit.  There is no height or weight on file to calculate BMI.  Physical Exam ***  No results found for this or any previous visit (from the past 2160 hour(s)).   Fall Risk:    09/14/2021    9:34 AM 07/11/2020    1:06 PM 05/28/2019   10:51 AM 04/27/2019    2:09 PM  04/12/2019   11:33 AM  Fall Risk   Falls in the past year? 0 0 0 0 0  Number falls in past yr: 0 0 0 0 0  Injury with Fall? 0 0 0 0 0  Follow up  Falls evaluation completed      ***  Functional Status Survey:   ***  Assessment & Plan  There are no diagnoses linked to this encounter.  -USPSTF grade A and B recommendations reviewed with patient; age-appropriate recommendations, preventive care, screening tests, etc discussed and encouraged; healthy living encouraged; see AVS for patient education given to patient -Discussed importance of 150 minutes of physical activity weekly, eat two servings of fish weekly, eat one serving of tree nuts ( cashews, pistachios, pecans, almonds.Marland Kitchen) every other day, eat 6 servings of fruit/vegetables daily and drink plenty of water and avoid sweet beverages.   -Reviewed Health Maintenance: {yes [id number]

## 2022-09-28 ENCOUNTER — Encounter: Payer: Self-pay | Admitting: Internal Medicine

## 2022-09-28 ENCOUNTER — Ambulatory Visit (INDEPENDENT_AMBULATORY_CARE_PROVIDER_SITE_OTHER): Payer: BC Managed Care – PPO | Admitting: Internal Medicine

## 2022-09-28 VITALS — BP 124/82 | HR 85 | Resp 16 | Ht 64.0 in | Wt 215.0 lb

## 2022-09-28 DIAGNOSIS — K644 Residual hemorrhoidal skin tags: Secondary | ICD-10-CM | POA: Diagnosis not present

## 2022-09-28 NOTE — Patient Instructions (Addendum)
It was great seeing you today!  Plan discussed at today's visit: -Start using preparation H, which is an over the counter steroid cream  -I also recommend sitz baths and increasing fiber intake  -Let me know if it gets bigger/more painful and we can have you see a surgeon   Follow up in: about a month   Take care and let us know if you have any questions or concerns prior to your next visit.  Dr. Rosana Grant  Hemorrhoids Hemorrhoids are swollen veins that may develop: In the butt (rectum). These are called internal hemorrhoids. Around the opening of the butt (anus). These are called external hemorrhoids. Hemorrhoids can cause pain, itching, or bleeding. Most of the time, they do not cause serious problems. They usually get better with diet changes, lifestyle changes, and other home treatments. What are the causes? This condition may be caused by: Having trouble pooping (constipation). Pushing hard (straining) to poop. Watery poop (diarrhea). Pregnancy. Being very overweight (obese). Sitting for long periods of time. Heavy lifting or other activity that causes you to strain. Anal sex. Riding a bike for a long period of time. What are the signs or symptoms? Symptoms of this condition include: Pain. Itching or soreness in the butt. Bleeding from the butt. Leaking poop. Swelling in the area. One or more lumps around the opening of your butt. How is this diagnosed? A doctor can often diagnose this condition by looking at the affected area. The doctor may also: Do an exam that involves feeling the area with a gloved hand (digital rectal exam). Examine the area inside your butt using a small tube (anoscope). Order blood tests. This may be done if you have lost a lot of blood. Have you get a test that involves looking inside the colon using a flexible tube with a camera on the end (sigmoidoscopy or colonoscopy). How is this treated? This condition can usually be treated at home. Your  doctor may tell you to change what you eat, make lifestyle changes, or try home treatments. If these do not help, procedures can be done to remove the hemorrhoids or make them smaller. These may involve: Placing rubber bands at the base of the hemorrhoids to cut off their blood supply. Injecting medicine into the hemorrhoids to shrink them. Shining a type of light energy onto the hemorrhoids to cause them to fall off. Doing surgery to remove the hemorrhoids or cut off their blood supply. Follow these instructions at home: Eating and drinking  Eat foods that have a lot of fiber in them. These include whole grains, beans, nuts, fruits, and vegetables. Ask your doctor about taking products that have added fiber (fibersupplements). Reduce the amount of fat in your diet. You can do this by: Eating low-fat dairy products. Eating less red meat. Avoiding processed foods. Drink enough fluid to keep your pee (urine) pale yellow. Managing pain and swelling  Take a warm-water bath (sitz bath) for 20 minutes to ease pain. Do this 3-4 times a day. You may do this in a bathtub or using a portable sitz bath that fits over the toilet. If told, put ice on the painful area. It may be helpful to use ice between your warm baths. Put ice in a plastic bag. Place a towel between your skin and the bag. Leave the ice on for 20 minutes, 2-3 times a day. General instructions Take over-the-counter and prescription medicines only as told by your doctor. Medicated creams and medicines may be used as  told. Exercise often. Ask your doctor how much and what kind of exercise is best for you. Go to the bathroom when you have the urge to poop. Do not wait. Avoid pushing too hard when you poop. Keep your butt dry and clean. Use wet toilet paper or moist towelettes after pooping. Do not sit on the toilet for a long time. Keep all follow-up visits as told by your doctor. This is important. Contact a doctor if you: Have pain  and swelling that do not get better with treatment or medicine. Have trouble pooping. Cannot poop. Have pain or swelling outside the area of the hemorrhoids. Get help right away if you have: Bleeding that will not stop. Summary Hemorrhoids are swollen veins in the butt or around the opening of the butt. They can cause pain, itching, or bleeding. Eat foods that have a lot of fiber in them. These include whole grains, beans, nuts, fruits, and vegetables. Take a warm-water bath (sitz bath) for 20 minutes to ease pain. Do this 3-4 times a day. This information is not intended to replace advice given to you by your health care provider. Make sure you discuss any questions you have with your health care provider. Document Revised: 02/03/2021 Document Reviewed: 02/04/2021 Elsevier Patient Education  [place].

## 2022-09-28 NOTE — Progress Notes (Signed)
   Acute Office Visit  Subjective:     Patient ID: Martha Grant, female    DOB: 1981/03/18, 42 y.o.   MRN: [id number]  Chief Complaint  Patient presents with   Acute Visit    HPI Patient is originally here for an annual visit, however she just started her period and would prefer to wait for Pap. She does want to discuss an acute problem today. She has been having some rectal pain and pressure on and off for years now. First started 11 years ago when she had her son, who was delivered via c section. She denies rectal bleeding or constipation, although she was constipated during her pregnancy. Mainly has pain/itchy now. Hasn't tried anything for it. Stools normal/regular and soft in caliber, no diarrhea. Patient has a picture showing an external hemorrhoid, fairly large.  Review of Systems  Constitutional:  Negative for chills and fever.  Gastrointestinal:  Negative for abdominal pain, blood in stool, constipation, diarrhea and melena.        Objective:    BP 124/82   Pulse 85   Resp 16   Ht 5' 4"$  (1.626 m)   Wt 215 lb (97.5 kg)   SpO2 98%   BMI 36.90 kg/m    Physical Exam Constitutional:      Appearance: Normal appearance.  HENT:     Head: Normocephalic and atraumatic.  Eyes:     Conjunctiva/sclera: Conjunctivae normal.  Cardiovascular:     Rate and Rhythm: Normal rate and regular rhythm.  Pulmonary:     Effort: Pulmonary effort is normal.     Breath sounds: Normal breath sounds.  Skin:    General: Skin is warm and dry.  Neurological:     General: No focal deficit present.     Mental Status: She is alert. Mental status is at baseline.  Psychiatric:        Mood and Affect: Mood normal.        Behavior: Behavior normal.     No results found for any visits on 09/28/22.      Assessment & Plan:   1. External hemorrhoid: No complications, mainly uncomfortable causing pain and itching. Does not have diarrhea/constipation or rectal bleeding. Will treat with over  the counter preparation H and sitz baths. Also discussed increasing hydration and fiber intake to help bulk stools. If symptoms worsen, will plan to refer to surgery. Follow up in the next few weeks for CPE with Pap.    Return in about 4 weeks (around 10/26/2022) for CPE .  Teodora Medici, DO

## 2022-10-11 NOTE — Patient Instructions (Incomplete)

## 2022-10-12 ENCOUNTER — Ambulatory Visit (INDEPENDENT_AMBULATORY_CARE_PROVIDER_SITE_OTHER): Payer: BC Managed Care – PPO | Admitting: Family Medicine

## 2022-10-12 ENCOUNTER — Other Ambulatory Visit (HOSPITAL_COMMUNITY)
Admission: RE | Admit: 2022-10-12 | Discharge: 2022-10-12 | Disposition: A | Discharge status: Home / Self Care | Payer: BC Managed Care – PPO | Source: Ambulatory Visit | Attending: Family Medicine | Admitting: Family Medicine

## 2022-10-12 ENCOUNTER — Encounter: Payer: Self-pay | Admitting: Family Medicine

## 2022-10-12 VITALS — BP 136/84 | HR 93 | Temp 98.1°F | Resp 16 | Ht 64.0 in | Wt 215.4 lb

## 2022-10-12 DIAGNOSIS — Z124 Encounter for screening for malignant neoplasm of cervix: Secondary | ICD-10-CM | POA: Insufficient documentation

## 2022-10-12 DIAGNOSIS — E559 Vitamin D deficiency, unspecified: Secondary | ICD-10-CM

## 2022-10-12 DIAGNOSIS — Z1231 Encounter for screening mammogram for malignant neoplasm of breast: Secondary | ICD-10-CM

## 2022-10-12 DIAGNOSIS — Z0000 Encounter for general adult medical examination without abnormal findings: Secondary | ICD-10-CM

## 2022-10-12 DIAGNOSIS — I10 Essential (primary) hypertension: Secondary | ICD-10-CM | POA: Insufficient documentation

## 2022-10-12 NOTE — Progress Notes (Unsigned)
Patient: Martha Grant, Female    DOB: February 26, 1981, 42 y.o.   MRN: [id number] Delsa Grana, PA-C Visit Date: 10/12/2022  Today's Provider: Delsa Grana, PA-C   Chief Complaint  Patient presents with   Annual Exam   Subjective:   Annual physical exam:  Martha Grant is a 42 y.o. female who presents today for complete physical exam:  SDOH Screenings   Food Insecurity: No Food Insecurity (10/12/2022)  Housing: Low Risk  (10/12/2022)  Transportation Needs: No Transportation Needs (09/28/2022)  Utilities: Not At Risk (10/12/2022)  Alcohol Screen: Low Risk  (10/12/2022)  Depression (PHQ2-9): Low Risk  (10/12/2022)  Financial Resource Strain: Low Risk  (10/12/2022)  Physical Activity: Insufficiently Active (10/12/2022)  Social Connections: Unknown (10/12/2022)  Stress: No Stress Concern Present (10/12/2022)  Tobacco Use: Low Risk  (10/12/2022)     USPSTF grade A and B recommendations - reviewed and addressed today  Depression:  Phq 9 completed today by patient, was reviewed by me with patient in the room PHQ score is neg, pt feels mood is good    10/12/2022    9:36 AM 09/28/2022    9:33 AM 09/14/2021    9:33 AM 10/23/2020   11:36 AM  PHQ 2/9 Scores  PHQ - 2 Score 0 0 0 0  PHQ- 9 Score 0 0 0       10/12/2022    9:36 AM 09/28/2022    9:33 AM 09/14/2021    9:33 AM 10/23/2020   11:36 AM 07/11/2020    1:07 PM  Depression screen PHQ 2/9  Decreased Interest 0 0 0 0 0  Down, Depressed, Hopeless 0 0 0 0 0  PHQ - 2 Score 0 0 0 0 0  Altered sleeping 0 0 0    Tired, decreased energy 0 0 0    Change in appetite 0 0 0    Feeling bad or failure about yourself  0 0 0    Trouble concentrating 0 0 0    Moving slowly or fidgety/restless 0 0 0    Suicidal thoughts 0 0 0    PHQ-9 Score 0 0 0    Difficult doing work/chores Not difficult at all  Not difficult at all      Alcohol screening: [place] Office Visit from 10/12/2022 in [facility]  AUDIT-C Score 0        Immunizations and Health Maintenance: Health Maintenance  Topic Date Due   MAMMOGRAM  11/19/2018   COVID-19 Vaccine (3 - Pfizer risk series) 10/28/2022 (Originally 05/22/2020)   PAP SMEAR-Modifier  05/27/2024   DTaP/Tdap/Td (6 - Td or Tdap) 09/15/2031   INFLUENZA VACCINE  Completed   Hepatitis C Screening  Completed   HIV Screening  Completed   HPV VACCINES  Aged Out     Lab Results  Component Value Date   TSH 1.12 09/14/2021     Hep C Screening: done  STD testing and prevention (HIV/chl/gon/syphilis):  see above, no additional testing desired by pt today  Intimate partner violence:  safe   Sexual History/Pain during Intercourse: Significant Other - same partner  Menstrual History/LMP/Abnormal Bleeding: regular cycles Patient's last menstrual period was 09/27/2022.  Incontinence Symptoms: none  Breast cancer: due when 42 Last Mammogram: *see HM list above BRCA gene screening: none known  Cervical cancer screening: due 05/2024 Pt denies family hx of cancers - breast, ovarian, uterine, colon:     Osteoporosis:   Discussion on osteoporosis per age,  including high calcium and vitamin D supplementation, weight bearing exercises   Skin cancer:  Hx of skin CA -  NO Discussed atypical lesions   Colorectal cancer:   Colonoscopy is not due per age 45 Discussed concerning signs and sx of CRC, pt denies   Lung cancer:   Low Dose CT Chest recommended if Age 40-80 years, 20 pack-year currently smoking OR have quit w/in 15years. Patient does not qualify.    Social History   Tobacco Use   Smoking status: Never   Smokeless tobacco: Never  Vaping Use   Vaping Use: Never used  Substance Use Topics   Alcohol use: No   Drug use: No     Flowsheet Row Office Visit from 10/12/2022 in [facility]  AUDIT-C Score 0       Family History  Problem Relation Age of Onset   Healthy Mother    Hypertension Father    Healthy Brother    Asthma Son     Breast cancer Maternal Grandmother    Hypertension Maternal Grandmother      Blood pressure/Hypertension: BP Readings from Last 3 Encounters:  10/12/22 136/84  09/28/22 124/82  10/22/21 (!) 153/102    Weight/Obesity: Wt Readings from Last 3 Encounters:  10/12/22 215 lb 6.4 oz (97.7 kg)  09/28/22 215 lb (97.5 kg)  10/22/21 241 lb (109.3 kg)   BMI Readings from Last 3 Encounters:  10/12/22 36.97 kg/m  09/28/22 36.90 kg/m  10/22/21 41.37 kg/m     Lipids:  Lab Results  Component Value Date   CHOL 163 05/28/2019   Lab Results  Component Value Date   HDL 49 (L) 05/28/2019   Lab Results  Component Value Date   LDLCALC 103 (H) 05/28/2019   Lab Results  Component Value Date   TRIG 36 05/28/2019   Lab Results  Component Value Date   CHOLHDL 3.3 05/28/2019   No results found for: "LDLDIRECT" Based on the results of lipid panel his/her cardiovascular risk factor ( using Jarrell )  in the next 10 years is: The ASCVD Risk score (Arnett DK, et al., 2019) failed to calculate for the following reasons:   The 2019 ASCVD risk score is only valid for ages 38 to 35  Glucose:  Glucose, Bld  Date Value Ref Range Status  10/11/2020 105 (H) 70 - 99 mg/dL Final    Comment:    Glucose reference range applies only to samples taken after fasting for at least 8 hours.  07/11/2020 88 65 - 99 mg/dL Final    Comment:    .            Fasting reference interval .   05/28/2019 93 65 - 99 mg/dL Final    Comment:    .            Fasting reference interval .     Advanced Care Planning:  A voluntary discussion about advance care planning including the explanation and discussion of advance directives.   Discussed health care proxy and Living will, and the patient was able to identify a health care proxy as mom, Myer Haff Patient does not have a living will at present time.   Social History       Social History   Socioeconomic History   Marital status:  Significant Other    Spouse name: Not on file   Number of children: 1   Years of education: 12   Highest education level: Associate  degree: academic program  Occupational History   Not on file  Tobacco Use   Smoking status: Never   Smokeless tobacco: Never  Vaping Use   Vaping Use: Never used  Substance and Sexual Activity   Alcohol use: No   Drug use: No   Sexual activity: Yes    Birth control/protection: None    Comment: hx depo and ocp, stopped ocp "years ago"  Other Topics Concern   Not on file  Social History Narrative   Not on file   Social Determinants of Health   Financial Resource Strain: Low Risk  (10/12/2022)   Overall Financial Resource Strain (CARDIA)    Difficulty of Paying Living Expenses: Not hard at all  Food Insecurity: No Food Insecurity (10/12/2022)   Hunger Vital Sign    Worried About Running Out of Food in the Last Year: Never true    Ran Out of Food in the Last Year: Never true  Transportation Needs: No Transportation Needs (09/28/2022)   PRAPARE - Hydrologist (Medical): No    Lack of Transportation (Non-Medical): No  Physical Activity: Insufficiently Active (10/12/2022)   Exercise Vital Sign    Days of Exercise per Week: 1 day    Minutes of Exercise per Session: 40 min  Stress: No Stress Concern Present (10/12/2022)   [place]    Feeling of Stress : Not at all  Social Connections: Unknown (10/12/2022)   Social Connection and Isolation Panel [NHANES]    Frequency of Communication with Friends and Family: More than three times a week    Frequency of Social Gatherings with Friends and Family: Twice a week    Attends Religious Services: Patient refused    Marine scientist or Organizations: Patient refused    Attends Music therapist: Patient refused    Marital Status: Never married    Family History        Family History  Problem Relation  Age of Onset   Healthy Mother    Hypertension Father    Healthy Brother    Asthma Son    Breast cancer Maternal Grandmother    Hypertension Maternal Grandmother     Patient Active Problem List   Diagnosis Date Noted   Class 3 severe obesity without serious comorbidity with body mass index (BMI) of 40.0 to 44.9 in adult [facility]) 09/14/2021   Headache disorder 05/14/2019    Past Surgical History:  Procedure Laterality Date   CESAREAN SECTION     x 2   LAPAROSCOPIC BILATERAL SALPINGO OOPHERECTOMY Bilateral    ULNA OSTEOTOMY Right 12/21/2013   Procedure: OPEN ULNAR SHORTENING OSTEOTOMY RIGHT ;  Surgeon: Wynonia Sours, MD;  Location: [facility];  Service: Orthopedics;  Laterality: Right;   WRIST ARTHROSCOPY WITH FOVEAL TRIANGULAR FIBROCARTILAGE COMPLEX REPAIR Right 12/21/2013   Procedure: ARTHROSCOPY DEBRIDEMENT TRIANGULAR CARTILAGE COMPLEX RIGHT WRIST;  Surgeon: Wynonia Sours, MD;  Location: [place];  Service: Orthopedics;  Laterality: Right;     Current Outpatient Medications:    butalbital-acetaminophen-caffeine (FIORICET) 50-325-40 MG tablet, Take 1 tablet by mouth every 4 (four) hours as needed., Disp: , Rfl:    lisinopril (ZESTRIL) 10 MG tablet, Take 10 mg by mouth daily., Disp: , Rfl:    nortriptyline (PAMELOR) 50 MG capsule, Take 50 mg by mouth at bedtime., Disp: , Rfl:    propranolol (INDERAL) 20 MG tablet, Take 20 mg by mouth  in the morning and at bedtime., Disp: , Rfl:    WEGOVY 2.4 MG/0.75ML SOAJ, Inject into the skin., Disp: , Rfl:    baclofen (LIORESAL) 10 MG tablet, Take 1 tablet (10 mg total) by mouth 3 (three) times daily. (Patient not taking: Reported on 10/12/2022), Disp: 30 each, Rfl: 0  No Known Allergies  Patient Care Team: Delsa Grana, PA-C as PCP - General (Family Medicine)   Chart Review:   Review of Systems  Constitutional: Negative.   HENT: Negative.    Eyes: Negative.   Respiratory: Negative.    Cardiovascular:  Negative.   Gastrointestinal: Negative.   Endocrine: Negative.   Genitourinary: Negative.   Musculoskeletal: Negative.   Skin: Negative.   Allergic/Immunologic: Negative.   Neurological: Negative.   Hematological: Negative.   Psychiatric/Behavioral: Negative.    All other systems reviewed and are negative.         Objective:   Vitals:  Vitals:   10/12/22 0937  BP: 136/84  Pulse: 93  Resp: 16  Temp: 98.1 F (36.7 C)  TempSrc: Oral  SpO2: 99%  Weight: 215 lb 6.4 oz (97.7 kg)  Height: '5\' 4"'$  (1.626 m)    Body mass index is 36.97 kg/m.  Physical Exam Exam conducted with a chaperone present.  Constitutional:      General: She is not in acute distress.    Appearance: Normal appearance. She is well-developed. She is obese. She is not ill-appearing, toxic-appearing or diaphoretic.  HENT:     Head: Normocephalic and atraumatic.     Right Ear: External ear normal.     Left Ear: External ear normal.     Nose: Nose normal.     Mouth/Throat:     Pharynx: Uvula midline.  Eyes:     General: Lids are normal.     Conjunctiva/sclera: Conjunctivae normal.     Pupils: Pupils are equal, round, and reactive to light.  Neck:     Trachea: Phonation normal. No tracheal deviation.  Cardiovascular:     Rate and Rhythm: Normal rate and regular rhythm.     Pulses: Normal pulses.          Radial pulses are 2+ on the right side and 2+ on the left side.       Posterior tibial pulses are 2+ on the right side and 2+ on the left side.     Heart sounds: Normal heart sounds. No murmur heard.    No friction rub. No gallop.  Pulmonary:     Effort: Pulmonary effort is normal. No respiratory distress.     Breath sounds: Normal breath sounds. No stridor. No wheezing, rhonchi or rales.  Chest:     Chest wall: No tenderness.  Abdominal:     General: Bowel sounds are normal. There is no distension.     Palpations: Abdomen is soft.     Tenderness: There is no abdominal tenderness. There is no  guarding or rebound.  Genitourinary:    Vagina: Normal.     Cervix: Normal.     Uterus: Normal.      Adnexa: Right adnexa normal and left adnexa normal.  Musculoskeletal:        General: No deformity. Normal range of motion.     Cervical back: Normal range of motion and neck supple.  Lymphadenopathy:     Cervical: No cervical adenopathy.  Skin:    General: Skin is warm and dry.     Capillary Refill: Capillary refill takes less than 2  seconds.     Coloration: Skin is not pale.     Findings: No rash.  Neurological:     Mental Status: She is alert and oriented to person, place, and time.     Motor: No abnormal muscle tone.     Gait: Gait normal.  Psychiatric:        Speech: Speech normal.        Behavior: Behavior normal.       Fall Risk:    10/12/2022    9:36 AM 09/28/2022    9:33 AM 09/14/2021    9:34 AM 07/11/2020    1:06 PM 05/28/2019   10:51 AM  Fall Risk   Falls in the past year? 0 0 0 0 0  Number falls in past yr: 0 0 0 0 0  Injury with Fall? 0 0 0 0 0  Risk for fall due to : No Fall Risks No Fall Risks     Follow up Falls prevention discussed;Education provided;Falls evaluation completed Falls prevention discussed  Falls evaluation completed     Functional Status Survey: Is the patient deaf or have difficulty hearing?: No Does the patient have difficulty seeing, even when wearing glasses/contacts?: No Does the patient have difficulty concentrating, remembering, or making decisions?: No Does the patient have difficulty walking or climbing stairs?: No Does the patient have difficulty dressing or bathing?: No Does the patient have difficulty doing errands alone such as visiting a doctor's office or shopping?: No   Assessment & Plan:    CPE completed today  USPSTF grade A and B recommendations reviewed with patient; age-appropriate recommendations, preventive care, screening tests, etc discussed and encouraged; healthy living encouraged; see AVS for patient  education given to patient  Discussed importance of 150 minutes of physical activity weekly, AHA exercise recommendations given to pt in AVS/handout  Discussed importance of healthy diet:  eating lean meats and proteins, avoiding trans fats and saturated fats, avoid simple sugars and excessive carbs in diet, eat 6 servings of fruit/vegetables daily and drink plenty of water and avoid sweet beverages.    Recommended pt to do annual eye exam and routine dental exams/cleanings  Depression, alcohol, fall screening completed as documented above and per flowsheets  Advance Care planning information and packet discussed and offered today, encouraged pt to discuss with family members/spouse/partner/friends and complete Advanced directive packet and bring copy to office   Reviewed Health Maintenance: Health Maintenance  Topic Date Due   MAMMOGRAM  11/19/2018   COVID-19 Vaccine (3 - Pfizer risk series) 10/28/2022 (Originally 05/22/2020)   PAP SMEAR-Modifier  05/27/2024   DTaP/Tdap/Td (6 - Td or Tdap) 09/15/2031   INFLUENZA VACCINE  Completed   Hepatitis C Screening  Completed   HIV Screening  Completed   HPV VACCINES  Aged Out    Immunizations: Immunization History  Administered Date(s) Administered   DTP 03/31/1988   Influenza-Unspecified 05/23/2019, 06/25/2020, 06/09/2021, 05/11/2022   MMR 03/31/1988   OPV 03/31/1988   PFIZER(Purple Top)SARS-COV-2 Vaccination 04/03/2020, 04/24/2020   Td 05/30/1997, 06/08/2000   Tdap 10/15/2010, 09/14/2021   Varicella 12/05/2010   Vaccines:  HPV: up to at age 20 , ask insurance if age between 63-45  Shingrix: 49-64 yo and ask insurance if covered when patient above 81 yo Pneumonia:  educated and discussed with patient. Flu:  educated and discussed with patient. COVID:      ICD-10-CM   1. Annual physical exam  123456 COMPLETE METABOLIC PANEL WITH GFR    CBC with Differential/Platelet  Lipid panel    Hemoglobin A1c    2. Screening for  malignant neoplasm of cervix  Z12.4 Cytology - PAP    3. Encounter for screening mammogram for malignant neoplasm of breast  Z12.31 MM 3D SCREENING MAMMOGRAM BILATERAL BREAST    4. Vitamin D deficiency  Q000111Q COMPLETE METABOLIC PANEL WITH GFR    VITAMIN D 25 Hydroxy (Vit-D Deficiency, Fractures)   labs previously very low, recheck, not on supplement right now    5. Primary hypertension  I10    now on lisinopril from weight management specialist          Delsa Grana, PA-C 10/12/22 10:13 AM  [facility]

## 2022-10-13 LAB — CYTOLOGY - PAP
Comment: NEGATIVE
Diagnosis: NEGATIVE
High risk HPV: NEGATIVE

## 2022-10-13 LAB — COMPLETE METABOLIC PANEL WITH GFR
AG Ratio: 1.1 (calc) (ref 1.0–2.5)
ALT: 15 U/L (ref 6–29)
AST: 16 U/L (ref 10–30)
Albumin: 3.7 g/dL (ref 3.6–5.1)
Alkaline phosphatase (APISO): 98 U/L (ref 31–125)
BUN: 9 mg/dL (ref 7–25)
CO2: 26 mmol/L (ref 20–32)
Calcium: 8.5 mg/dL — ABNORMAL LOW (ref 8.6–10.2)
Chloride: 105 mmol/L (ref 98–110)
Creat: 0.72 mg/dL (ref 0.50–0.97)
Globulin: 3.3 g/dL (calc) (ref 1.9–3.7)
Glucose, Bld: 84 mg/dL (ref 65–99)
Potassium: 4.1 mmol/L (ref 3.5–5.3)
Sodium: 140 mmol/L (ref 135–146)
Total Bilirubin: 0.3 mg/dL (ref 0.2–1.2)
Total Protein: 7 g/dL (ref 6.1–8.1)
eGFR: 109 mL/min/{1.73_m2} (ref >=60)

## 2022-10-13 LAB — CBC WITH DIFFERENTIAL/PLATELET
Absolute Monocytes: 378 cells/uL (ref 200–950)
Basophils Absolute: 31 cells/uL (ref 0–200)
Basophils Relative: 0.5 %
Eosinophils Absolute: 167 cells/uL (ref 15–500)
Eosinophils Relative: 2.7 %
HCT: 35.7 % (ref 35.0–45.0)
Hemoglobin: 12.1 g/dL (ref 11.7–15.5)
Lymphs Abs: 2065 cells/uL (ref 850–3900)
MCH: 32.2 pg (ref 27.0–33.0)
MCHC: 33.9 g/dL (ref 32.0–36.0)
MCV: 94.9 fL (ref 80.0–100.0)
MPV: 10.1 fL (ref 7.5–12.5)
Monocytes Relative: 6.1 %
Neutro Abs: 3559 cells/uL (ref 1500–7800)
Neutrophils Relative %: 57.4 %
Platelets: 296 10*3/uL (ref 140–400)
RBC: 3.76 10*6/uL — ABNORMAL LOW (ref 3.80–5.10)
RDW: 12.4 % (ref 11.0–15.0)
Total Lymphocyte: 33.3 %
WBC: 6.2 10*3/uL (ref 3.8–10.8)

## 2022-10-13 LAB — HEMOGLOBIN A1C
Hgb A1c MFr Bld: 5.6 % of total Hgb (ref <5.7)
Mean Plasma Glucose: 114 mg/dL
eAG (mmol/L): 6.3 mmol/L

## 2022-10-13 LAB — LIPID PANEL
Cholesterol: 153 mg/dL (ref <200)
HDL: 42 mg/dL — ABNORMAL LOW (ref >=50)
LDL Cholesterol (Calc): 98 mg/dL (calc)
Non-HDL Cholesterol (Calc): 111 mg/dL (calc) (ref <130)
Total CHOL/HDL Ratio: 3.6 (calc) (ref <5.0)
Triglycerides: 44 mg/dL (ref <150)

## 2022-10-13 LAB — VITAMIN D 25 HYDROXY (VIT D DEFICIENCY, FRACTURES): Vit D, 25-Hydroxy: 22 ng/mL — ABNORMAL LOW (ref 30–100)

## 2022-10-14 ENCOUNTER — Encounter: Payer: Self-pay | Admitting: Family Medicine

## 2022-10-14 DIAGNOSIS — A5909 Other urogenital trichomoniasis: Secondary | ICD-10-CM

## 2022-10-15 MED ORDER — METRONIDAZOLE 500 MG PO TABS: 500.0000 mg | ORAL_TABLET | Freq: Two times a day (BID) | ORAL | 0 refills | Status: AC

## 2022-11-26 ENCOUNTER — Encounter: Payer: Self-pay | Admitting: Family Medicine

## 2022-11-26 ENCOUNTER — Ambulatory Visit (INDEPENDENT_AMBULATORY_CARE_PROVIDER_SITE_OTHER): Payer: BC Managed Care – PPO | Admitting: Family Medicine

## 2022-11-26 ENCOUNTER — Other Ambulatory Visit (HOSPITAL_COMMUNITY)
Admission: RE | Admit: 2022-11-26 | Discharge: 2022-11-26 | Disposition: A | Discharge status: Home / Self Care | Payer: BC Managed Care – PPO | Source: Ambulatory Visit | Attending: Family Medicine | Admitting: Family Medicine

## 2022-11-26 VITALS — BP 138/86 | HR 88 | Temp 98.2°F | Resp 16 | Ht 64.0 in | Wt 214.4 lb

## 2022-11-26 DIAGNOSIS — Z113 Encounter for screening for infections with a predominantly sexual mode of transmission: Secondary | ICD-10-CM | POA: Diagnosis present

## 2022-11-26 DIAGNOSIS — A5901 Trichomonal vulvovaginitis: Secondary | ICD-10-CM

## 2022-11-26 NOTE — Progress Notes (Unsigned)
Patient ID: Martha Grant, female    DOB: 06-Nov-1980, 42 y.o.   MRN: [id number]  PCP: Danelle Berry, PA-C  Chief Complaint  Patient presents with   Exposure to STD    Subjective:   Martha Grant is a 42 y.o. female, presents to clinic with CC of the following:  HPI  Trich + with recent Pap smear she was asymptomatic Her most recent partner told her vaguely that he had some kind of infection and she needed to get checked She is here to get rechecked and continues to be asymptomatic She has not been sexually active since last office visit   Patient Active Problem List   Diagnosis Date Noted   Hypertension 10/12/2022   Class 3 severe obesity without serious comorbidity with body mass index (BMI) of 40.0 to 44.9 in adult 09/14/2021   Headache disorder 05/14/2019      Current Outpatient Medications:    lisinopril (ZESTRIL) 10 MG tablet, Take 10 mg by mouth daily., Disp: , Rfl:    nortriptyline (PAMELOR) 50 MG capsule, Take 50 mg by mouth at bedtime., Disp: , Rfl:    propranolol (INDERAL) 20 MG tablet, Take 20 mg by mouth in the morning and at bedtime., Disp: , Rfl:    WEGOVY 2.4 MG/0.75ML SOAJ, Inject into the skin., Disp: , Rfl:    baclofen (LIORESAL) 10 MG tablet, Take 1 tablet (10 mg total) by mouth 3 (three) times daily. (Patient not taking: Reported on 11/26/2022), Disp: 30 each, Rfl: 0   butalbital-acetaminophen-caffeine (FIORICET) 50-325-40 MG tablet, Take 1 tablet by mouth every 4 (four) hours as needed. (Patient not taking: Reported on 11/26/2022), Disp: , Rfl:    No Known Allergies   Social History   Tobacco Use   Smoking status: Never   Smokeless tobacco: Never  Vaping Use   Vaping Use: Never used  Substance Use Topics   Alcohol use: No   Drug use: No      Chart Review Today: I personally reviewed active problem list, medication list, allergies, family history, social history, health maintenance, notes from last encounter, lab results, imaging with the  patient/caregiver today.   Review of Systems  Constitutional: Negative.   HENT: Negative.    Eyes: Negative.   Respiratory: Negative.    Cardiovascular: Negative.   Gastrointestinal: Negative.   Endocrine: Negative.   Genitourinary: Negative.   Musculoskeletal: Negative.   Skin: Negative.   Allergic/Immunologic: Negative.   Neurological: Negative.   Hematological: Negative.   Psychiatric/Behavioral: Negative.    All other systems reviewed and are negative.      Objective:   Vitals:   11/26/22 0818  BP: (!) 144/88  Pulse: 88  Resp: 16  Temp: 98.2 F (36.8 C)  TempSrc: Oral  SpO2: 99%  Weight: 214 lb 6.4 oz (97.3 kg)  Height:  (1.626 m)    Body mass index is 36.8 kg/m.  Physical Exam Vitals and nursing note reviewed.  Constitutional:      General: She is not in acute distress.    Appearance: Normal appearance. She is well-developed. She is not ill-appearing, toxic-appearing or diaphoretic.  HENT:     Head: Normocephalic and atraumatic.     Nose: Nose normal.  Eyes:     General:        Right eye: No discharge.        Left eye: No discharge.     Conjunctiva/sclera: Conjunctivae normal.  Neck:     Trachea: No  tracheal deviation.  Cardiovascular:     Rate and Rhythm: Normal rate and regular rhythm.  Pulmonary:     Effort: Pulmonary effort is normal. No respiratory distress.     Breath sounds: No stridor.  Musculoskeletal:        General: Normal range of motion.  Skin:    General: Skin is warm and dry.     Findings: No rash.  Neurological:     Mental Status: She is alert.     Motor: No abnormal muscle tone.     Coordination: Coordination normal.  Psychiatric:        Behavior: Behavior normal.      Results for orders placed or performed in visit on 10/12/22  COMPLETE METABOLIC PANEL WITH GFR  Result Value Ref Range   Glucose, Bld 84 65 - 99 mg/dL   BUN 9 7 - 25 mg/dL   Creat 1.61 0.96 - 0.45 mg/dL   eGFR 409 > OR = 60 WJ/XBJ/4.78G9    BUN/Creatinine Ratio SEE NOTE: 6 - 22 (calc)   Sodium 140 135 - 146 mmol/L   Potassium 4.1 3.5 - 5.3 mmol/L   Chloride 105 98 - 110 mmol/L   CO2 26 20 - 32 mmol/L   Calcium 8.5 (L) 8.6 - 10.2 mg/dL   Total Protein 7.0 6.1 - 8.1 g/dL   Albumin 3.7 3.6 - 5.1 g/dL   Globulin 3.3 1.9 - 3.7 g/dL (calc)   AG Ratio 1.1 1.0 - 2.5 (calc)   Total Bilirubin 0.3 0.2 - 1.2 mg/dL   Alkaline phosphatase (APISO) 98 31 - 125 U/L   AST 16 10 - 30 U/L   ALT 15 6 - 29 U/L  CBC with Differential/Platelet  Result Value Ref Range   WBC 6.2 3.8 - 10.8 Thousand/uL   RBC 3.76 (L) 3.80 - 5.10 Million/uL   Hemoglobin 12.1 11.7 - 15.5 g/dL   HCT 56.2 13.0 - 86.5 %   MCV 94.9 80.0 - 100.0 fL   MCH 32.2 27.0 - 33.0 pg   MCHC 33.9 32.0 - 36.0 g/dL   RDW 78.4 69.6 - 29.5 %   Platelets 296 140 - 400 Thousand/uL   MPV 10.1 7.5 - 12.5 fL   Neutro Abs 3,559 1,500 - 7,800 cells/uL   Lymphs Abs 2,065 850 - 3,900 cells/uL   Absolute Monocytes 378 200 - 950 cells/uL   Eosinophils Absolute 167 15 - 500 cells/uL   Basophils Absolute 31 0 - 200 cells/uL   Neutrophils Relative % 57.4 %   Total Lymphocyte 33.3 %   Monocytes Relative 6.1 %   Eosinophils Relative 2.7 %   Basophils Relative 0.5 %  Lipid panel  Result Value Ref Range   Cholesterol 153 <200 mg/dL   HDL 42 (L) > OR = 50 mg/dL   Triglycerides 44 <284 mg/dL   LDL Cholesterol (Calc) 98 mg/dL (calc)   Total CHOL/HDL Ratio 3.6 <5.0 (calc)   Non-HDL Cholesterol (Calc) 111 <130 mg/dL (calc)  Hemoglobin X3K  Result Value Ref Range   Hgb A1c MFr Bld 5.6 <5.7 % of total Hgb   Mean Plasma Glucose 114 mg/dL   eAG (mmol/L) 6.3 mmol/L  VITAMIN D 25 Hydroxy (Vit-D Deficiency, Fractures)  Result Value Ref Range   Vit D, 25-Hydroxy 22 (L) 30 - 100 ng/mL  Cytology - PAP  Result Value Ref Range   High risk HPV Negative    Adequacy      Satisfactory for evaluation; transformation zone component PRESENT.  Diagnosis      - Negative for intraepithelial lesion or  malignancy (NILM)   Microorganisms Trichomonas vaginalis present    Comment Normal Reference Range HPV - Negative        Assessment & Plan:   1. Screen for STD (sexually transmitted disease) Possible exposure - vague info given to her from last female partner, not recent sexual activity, no sx Recheck all STDs and tx anything positive - Cervicovaginal ancillary only - HIV antibody (with reflex) - RPR  2. Trichomonal vaginitis Recent pap with asx note of trich Recheck stds no sx - Cervicovaginal ancillary only      Danelle Berry, PA-C 11/26/22 8:52 AM

## 2022-11-27 LAB — HIV ANTIBODY (ROUTINE TESTING W REFLEX): HIV 1&2 Ab, 4th Generation: NONREACTIVE

## 2022-11-27 LAB — RPR: RPR Ser Ql: NONREACTIVE

## 2022-11-29 LAB — CERVICOVAGINAL ANCILLARY ONLY
Bacterial Vaginitis (gardnerella): NEGATIVE
Candida Glabrata: NEGATIVE
Candida Vaginitis: NEGATIVE
Chlamydia: NEGATIVE
Comment: NEGATIVE
Comment: NEGATIVE
Comment: NEGATIVE
Comment: NEGATIVE
Comment: NEGATIVE
Comment: NORMAL
Neisseria Gonorrhea: NEGATIVE
Trichomonas: NEGATIVE

## 2022-12-02 ENCOUNTER — Encounter: Payer: Self-pay | Admitting: Family Medicine

## 2023-01-11 ENCOUNTER — Encounter: Payer: Self-pay | Admitting: Family Medicine

## 2023-01-11 ENCOUNTER — Telehealth (INDEPENDENT_AMBULATORY_CARE_PROVIDER_SITE_OTHER): Payer: BC Managed Care – PPO | Admitting: Family Medicine

## 2023-01-11 DIAGNOSIS — Z6836 Body mass index (BMI) 36.0-36.9, adult: Secondary | ICD-10-CM

## 2023-01-11 DIAGNOSIS — E669 Obesity, unspecified: Secondary | ICD-10-CM

## 2023-01-11 DIAGNOSIS — R739 Hyperglycemia, unspecified: Secondary | ICD-10-CM

## 2023-01-11 DIAGNOSIS — I10 Essential (primary) hypertension: Secondary | ICD-10-CM

## 2023-01-11 NOTE — Progress Notes (Unsigned)
   Name: Martha Grant   MRN: [id number]    DOB: [birth year]   Date:01/11/2023       Progress Note  Subjective:    Chief Complaint  Request for new med OV/virtual visit not done today   HPI Pt made appt today for addressing meds for weight and DM (? Prediabetes?) She has been managing this with [facility] providers for a few years, looks like she was on saxenda, wegovy and then insurance stopped covering so they sent in ozempic In our system A1C prediabetes to normal Lab Results  Component Value Date   HGBA1C 5.6 10/12/2022   HGBA1C 5.9 (H) 05/28/2019  Through care everywhere: Recent Data from [facility] Related to Hemoglobin A1c Component 08/17/22 12/21/21 09/22/21 11/04/20  Hemoglobin A1C 5.4 5.4 6.1 High  5.6   Per chart review I see no A1C >6.5, no random or fasting CBGs >126 x 2, she has obesity and prediabetes but not T2DM that I can tell Will likely try to rx zepbound since unable to rx mounjaro due to no Dx Pts ability to get med will likely be up to insurance coverage   Appt rescheduled for 6/7   Danelle Berry, PA-C 01/12/23 5:25 PM

## 2023-01-14 ENCOUNTER — Telehealth (INDEPENDENT_AMBULATORY_CARE_PROVIDER_SITE_OTHER): Payer: BC Managed Care – PPO | Admitting: Family Medicine

## 2023-01-14 DIAGNOSIS — Z6836 Body mass index (BMI) 36.0-36.9, adult: Secondary | ICD-10-CM

## 2023-01-14 DIAGNOSIS — E669 Obesity, unspecified: Secondary | ICD-10-CM

## 2023-01-14 NOTE — Progress Notes (Unsigned)
   Name: Martha Grant   MRN: [id number]    DOB: 12-21-1980   Date:01/14/2023       Progress Note  Subjective:    Chief Complaint  Chief Complaint  Patient presents with   Follow-up    Sugar levels   Pt let w/o being seen and we were unable to reach her before, during or after appt time by video or phone call today    by a video enabled telemedicine application and verified that I am speaking with the correct person using two identifiers.  I discussed the limitations of evaluation and management by telemedicine and the availability of in person appointments. The patient expressed understanding and agreed to proceed. Staff also discussed with the patient that there may be a patient responsible charge related to this service. Patient Location:  Provider Location: [facility] office Additional Individuals present: none   On video and pt was connected but left I have called her cell and left a VM asking her to try and reconnect  HPI   Patient Active Problem List   Diagnosis Date Noted   Hypertension 10/12/2022   Class 3 severe obesity without serious comorbidity with body mass index (BMI) of 40.0 to 44.9 in adult [facility]) 09/14/2021   Headache disorder 05/14/2019    Social History   Tobacco Use   Smoking status: Never   Smokeless tobacco: Never  Substance Use Topics   Alcohol use: No     Current Outpatient Medications:    lisinopril (ZESTRIL) 10 MG tablet, Take 10 mg by mouth daily., Disp: , Rfl:    nortriptyline (PAMELOR) 50 MG capsule, Take 50 mg by mouth at bedtime., Disp: , Rfl:    propranolol (INDERAL) 20 MG tablet, Take 20 mg by mouth in the morning and at bedtime., Disp: , Rfl:    baclofen (LIORESAL) 10 MG tablet, Take 1 tablet (10 mg total) by mouth 3 (three) times daily. (Patient not taking: Reported on 11/26/2022), Disp: 30 each, Rfl: 0   butalbital-acetaminophen-caffeine (FIORICET) 50-325-40 MG tablet, Take 1 tablet by mouth every 4 (four) hours as needed. (Patient not  taking: Reported on 11/26/2022), Disp: , Rfl:    WEGOVY 2.4 MG/0.75ML SOAJ, Inject into the skin. (Patient not taking: Reported on 01/11/2023), Disp: , Rfl:   No Known Allergies    Review of Systems    Objective:   Virtual encounter, vitals limited, only able to obtain the following There were no vitals filed for this visit. There is no height or weight on file to calculate BMI. Nursing Note and Vital Signs reviewed.  Physical Exam  PE limited by virtual encounter  No results found for this or any previous visit (from the past 72 hour(s)).  Assessment and Plan:   No diagnosis found.   -Red flags and when to present for emergency care or RTC including fever >101.34F, chest pain, shortness of breath, new/worsening/un-resolving symptoms, reviewed with patient at time of visit. Follow up and care instructions discussed and provided in AVS. - I discussed the assessment and treatment plan with the patient. The patient was provided an opportunity to ask questions and all were answered. The patient agreed with the plan and demonstrated an understanding of the instructions.  I provided  minutes of non-face-to-face time during this encounter.  Danelle Berry, PA-C 01/14/23 9:02 AM

## 2023-03-16 ENCOUNTER — Telehealth: Payer: BC Managed Care – PPO | Admitting: Family Medicine

## 2023-03-16 ENCOUNTER — Encounter: Payer: Self-pay | Admitting: Family Medicine

## 2023-03-16 DIAGNOSIS — E669 Obesity, unspecified: Secondary | ICD-10-CM

## 2023-03-16 DIAGNOSIS — R7303 Prediabetes: Secondary | ICD-10-CM | POA: Diagnosis not present

## 2023-03-16 DIAGNOSIS — Z6836 Body mass index (BMI) 36.0-36.9, adult: Secondary | ICD-10-CM | POA: Diagnosis not present

## 2023-03-16 MED ORDER — TIRZEPATIDE-WEIGHT MANAGEMENT 2.5 MG/0.5ML ~~LOC~~ SOAJ
2.5000 mg | SUBCUTANEOUS | 0 refills | Status: DC
Start: 2023-03-16 — End: 2023-05-03

## 2023-03-16 NOTE — Patient Instructions (Addendum)
I sent zepbound/tirzepatide to the pharmacy - you can see contact them to see what happens when run with your insurance  The last medication you were on was wegovy - which is the drug name for weight management. Reviewing all the labs and records in your chart I do not see anything that is consistent with a Type 2 Diabetes diagnosis.  Last year you did have one A1C in the prediabetic range.  [email or web address]   Type 2 Diabetes is diagnosed with A1C 6.5% or higher, random glucose 200+, or 2 fasting glucose >125.  Right now I have put the meds in with your prior weight/bmi category and your diagnosis of prediabetes.  You can come by the office and retest your A1C and see if it has increased at all and if you ever get the diagnosis of Type 2 diabetes we can try to prescribe similar meds for diabetes and see again is isurance approves them or not.  Lab Results  Component Value Date   HGBA1C 5.6 10/12/2022

## 2023-03-16 NOTE — Progress Notes (Signed)
Name: Martha Grant   MRN: [id number]    DOB: 1981/06/03   Date:03/16/2023       Progress Note  Subjective:    Chief Complaint  Chief Complaint  Patient presents with   New Med Request    Would like to try Mounjaro    I connected with  Margaurite D Vandeberg  on 03/16/23 at  1:40 PM EDT by a video enabled telemedicine application and verified that I am speaking with the correct person using two identifiers.  I discussed the limitations of evaluation and management by telemedicine and the availability of in person appointments. The patient expressed understanding and agreed to proceed. Staff also discussed with the patient that there may be a patient responsible charge related to this service. Patient Location: work Chiropractor Location: [facility] office Additional Individuals present: none  HPI Asking for mounjaro or zepbound She was on wegovy through specialty clinic and insurance stopped covering those meds,.   Patient Active Problem List   Diagnosis Date Noted   Hypertension 10/12/2022   Class 3 severe obesity without serious comorbidity with body mass index (BMI) of 40.0 to 44.9 in adult [facility]) 09/14/2021   Headache disorder 05/14/2019    Social History   Tobacco Use   Smoking status: Never   Smokeless tobacco: Never  Substance Use Topics   Alcohol use: No     Current Outpatient Medications:    lisinopril (ZESTRIL) 10 MG tablet, Take 10 mg by mouth daily., Disp: , Rfl:    nortriptyline (PAMELOR) 50 MG capsule, Take 50 mg by mouth at bedtime., Disp: , Rfl:    propranolol (INDERAL) 20 MG tablet, Take 20 mg by mouth in the morning and at bedtime., Disp: , Rfl:    baclofen (LIORESAL) 10 MG tablet, Take 1 tablet (10 mg total) by mouth 3 (three) times daily. (Patient not taking: Reported on 11/26/2022), Disp: 30 each, Rfl: 0   butalbital-acetaminophen-caffeine (FIORICET) 50-325-40 MG tablet, Take 1 tablet by mouth every 4 (four) hours as needed. (Patient not taking: Reported on  11/26/2022), Disp: , Rfl:   No Known Allergies  I personally reviewed active problem list, medication list, allergies, family history, social history, health maintenance, notes from last encounter, lab results, imaging with the patient/caregiver today.   Review of Systems    Objective:   Virtual encounter, vitals limited, only able to obtain the following There were no vitals filed for this visit. There is no height or weight on file to calculate BMI. Nursing Note and Vital Signs reviewed.  Physical Exam Pt alert, well appearing, NAD  PE limited by virtual encounter  No results found for this or any previous visit (from the past 72 hour(s)).  Assessment and Plan:     ICD-10-CM   1. Class 2 obesity with body mass index (BMI) of 36.0 to 36.9 in adult, unspecified obesity type, unspecified whether serious comorbidity present  E66.9 tirzepatide (ZEPBOUND) 2.5 MG/0.5ML Pen   Z68.36     2. Prediabetes  R73.03 tirzepatide (ZEPBOUND) 2.5 MG/0.5ML Pen   highest A1C in chart is 6.1 last february, recent labs A1C has been in normal range WHILE on GLP-1 medications for weight loss      Lengthly info typed up and sent through AVS to pt explaining meds/dx of T2DM and plan to see what insurance might cover.  - I discussed the assessment and treatment plan with the patient. The patient was provided an opportunity to ask questions and all were answered.  The patient agreed with the plan and demonstrated an understanding of the instructions.  I provided 23+ minutes of non-face-to-face time during this encounter.  Danelle Berry, PA-C 03/16/23 1:57 PM

## 2023-04-14 ENCOUNTER — Ambulatory Visit: Payer: BC Managed Care – PPO | Admitting: Family Medicine

## 2023-04-15 ENCOUNTER — Ambulatory Visit: Payer: BC Managed Care – PPO | Admitting: Family Medicine

## 2023-04-26 ENCOUNTER — Other Ambulatory Visit: Payer: Self-pay | Admitting: Family Medicine

## 2023-04-26 DIAGNOSIS — R7303 Prediabetes: Secondary | ICD-10-CM

## 2023-04-26 DIAGNOSIS — E669 Obesity, unspecified: Secondary | ICD-10-CM

## 2023-05-03 ENCOUNTER — Other Ambulatory Visit: Payer: Self-pay | Admitting: Nurse Practitioner

## 2023-05-03 DIAGNOSIS — E669 Obesity, unspecified: Secondary | ICD-10-CM

## 2023-05-03 MED ORDER — ZEPBOUND 5 MG/0.5ML ~~LOC~~ SOAJ
5.0000 mg | SUBCUTANEOUS | 0 refills | Status: DC
Start: 2023-05-03 — End: 2023-07-14

## 2023-05-06 ENCOUNTER — Ambulatory Visit: Payer: BC Managed Care – PPO | Admitting: Family Medicine

## 2023-06-29 NOTE — Progress Notes (Deleted)
   Acute Office Visit  Subjective:     Patient ID: Martha Grant, female    DOB: Jun 18, 1981, 42 y.o.   MRN: [id number]  No chief complaint on file.   HPI Patient is in today for to discuss potential breast reduction.   ROS      Objective:    There were no vitals taken for this visit. {Vitals History (Optional):23777}  Physical Exam  No results found for any visits on 07/01/23.      Assessment & Plan:   Problem List Items Addressed This Visit   None   No orders of the defined types were placed in this encounter.   No follow-ups on file.  Margarita Mail, DO

## 2023-07-01 ENCOUNTER — Ambulatory Visit: Payer: BC Managed Care – PPO | Admitting: Internal Medicine

## 2023-07-12 NOTE — Progress Notes (Unsigned)
   Acute Office Visit  Subjective:     Patient ID: Martha Grant, female    DOB: 03-26-1981, 42 y.o.   MRN: [id number]  No chief complaint on file.   HPI Patient is in today for to discuss potential breast reduction.   ROS      Objective:    There were no vitals taken for this visit. {Vitals History (Optional):23777}  Physical Exam  No results found for any visits on 07/14/23.      Assessment & Plan:   Problem List Items Addressed This Visit   None   No orders of the defined types were placed in this encounter.   No follow-ups on file.  Margarita Mail, DO

## 2023-07-14 ENCOUNTER — Encounter: Payer: Self-pay | Admitting: Internal Medicine

## 2023-07-14 ENCOUNTER — Other Ambulatory Visit: Payer: Self-pay

## 2023-07-14 ENCOUNTER — Ambulatory Visit (INDEPENDENT_AMBULATORY_CARE_PROVIDER_SITE_OTHER): Payer: BC Managed Care – PPO | Admitting: Internal Medicine

## 2023-07-14 VITALS — BP 120/84 | HR 84 | Temp 98.2°F | Resp 16 | Ht 64.0 in | Wt 233.4 lb

## 2023-07-14 DIAGNOSIS — N62 Hypertrophy of breast: Secondary | ICD-10-CM | POA: Diagnosis not present

## 2023-07-14 MED ORDER — IBUPROFEN 600 MG PO TABS
600.0000 mg | ORAL_TABLET | Freq: Three times a day (TID) | ORAL | 3 refills | Status: AC | PRN
Start: 2023-07-14 — End: ?

## 2023-10-04 ENCOUNTER — Encounter: Payer: Self-pay | Admitting: Plastic Surgery

## 2023-10-04 ENCOUNTER — Ambulatory Visit (INDEPENDENT_AMBULATORY_CARE_PROVIDER_SITE_OTHER): Payer: 59 | Admitting: Plastic Surgery

## 2023-10-04 VITALS — BP 175/109 | HR 68 | Ht 64.0 in | Wt 241.6 lb

## 2023-10-04 DIAGNOSIS — M545 Low back pain, unspecified: Secondary | ICD-10-CM

## 2023-10-04 DIAGNOSIS — M542 Cervicalgia: Secondary | ICD-10-CM | POA: Diagnosis not present

## 2023-10-04 DIAGNOSIS — N62 Hypertrophy of breast: Secondary | ICD-10-CM | POA: Insufficient documentation

## 2023-10-04 DIAGNOSIS — M549 Dorsalgia, unspecified: Secondary | ICD-10-CM | POA: Insufficient documentation

## 2023-10-04 DIAGNOSIS — M546 Pain in thoracic spine: Secondary | ICD-10-CM

## 2023-10-04 DIAGNOSIS — G8929 Other chronic pain: Secondary | ICD-10-CM

## 2023-10-04 DIAGNOSIS — Z6841 Body Mass Index (BMI) 40.0 and over, adult: Secondary | ICD-10-CM

## 2023-10-04 NOTE — Progress Notes (Addendum)
 Patient ID: Martha Grant, female    DOB: 01-12-1981, 43 y.o.   MRN: [id number]   Chief Complaint  Patient presents with   Advice Only   Breast Problem    Mammary Hyperplasia: The patient is a 43 y.o. female with a history of mammary hyperplasia for several years.  She has extremely large breasts causing symptoms that include the following: Back pain in the upper and lower back, including neck pain. She pulls or pins her bra straps to provide better lift and relief of the pressure and pain. She notices relief by holding her breast up manually.  Her shoulder straps cause grooves and pain and pressure that requires padding for relief. Pain medication is sometimes required with motrin  and tylenol .  Activities that are hindered by enlarged breasts include: exercise and running.  She has tried supportive clothing as well as fitted bras without improvement.  Her breasts are extremely large and fairly symmetric.  She has hyperpigmentation of the inframammary area on both sides.  The sternal to nipple distance on the right is 36 cm and the left is 37 cm.  The IMF distance is 18 cm.  She is 5 feet 4 inches tall and weighs 241 pounds.  The BMI = 41.4 kg/m.  Preoperative bra size = 40H cup.  The estimated excess breast tissue to be removed at the time of surgery = 1000 grams on the left and 1000 grams on the right.  Mammogram history: none.  Family history of breast cancer:  none.  Tobacco use:  none.   The patient expresses the desire to pursue surgical intervention.  The patient's past medical history includes back pain, migraines and polycystic ovary disease.  She has had a C-section in the past and recovered well from that.     Review of Systems  Constitutional:  Positive for activity change. Negative for appetite change.  HENT: Negative.    Eyes: Negative.   Respiratory: Negative.  Negative for chest tightness.   Cardiovascular: Negative.   Gastrointestinal: Negative.   Endocrine: Negative.    Genitourinary: Negative.   Musculoskeletal:  Positive for back pain and neck pain.  Skin:  Positive for rash.    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    Past Surgical History:  Procedure Laterality Date   CESAREAN SECTION     x 2   LAPAROSCOPIC BILATERAL SALPINGO OOPHERECTOMY Bilateral    ULNA OSTEOTOMY Right 12/21/2013   Procedure: OPEN ULNAR SHORTENING OSTEOTOMY RIGHT ;  Surgeon: Kemp Patter, MD;  Location: [facility];  Service: Orthopedics;  Laterality: Right;   WRIST ARTHROSCOPY WITH FOVEAL TRIANGULAR FIBROCARTILAGE COMPLEX REPAIR Right 12/21/2013   Procedure: ARTHROSCOPY DEBRIDEMENT TRIANGULAR CARTILAGE COMPLEX RIGHT WRIST;  Surgeon: Kemp Patter, MD;  Location: [facility];  Service: Orthopedics;  Laterality: Right;      Current Outpatient Medications:    ibuprofen  (ADVIL ) 600 MG tablet, Take 1 tablet (600 mg total) by mouth every 8 (eight) hours as needed for moderate pain (pain score 4-6)., Disp: 30 tablet, Rfl: 3   lisinopril (ZESTRIL) 10 MG tablet, Take 10 mg by mouth daily., Disp: , Rfl:    nortriptyline (PAMELOR) 50 MG capsule, Take 50 mg by mouth at bedtime., Disp: , Rfl:    propranolol (INDERAL) 20 MG tablet, Take 20 mg by  mouth in the morning and at bedtime., Disp: , Rfl:    Objective:   Vitals:   10/04/23 0838 10/04/23 0900  BP: (!) 179/113 (!) 175/109  Pulse: 74 68  SpO2: 98%     Physical Exam Vitals reviewed.  Constitutional:      Appearance: Normal appearance.  HENT:     Head: Normocephalic and atraumatic.  Cardiovascular:     Rate and Rhythm: Normal rate.     Pulses: Normal pulses.  Pulmonary:     Effort: Pulmonary effort is normal.  Abdominal:     Palpations: Abdomen is soft.  Musculoskeletal:        General: No swelling, tenderness or deformity.  Skin:    General: Skin is warm.     Capillary  Refill: Capillary refill takes less than 2 seconds.  Neurological:     Mental Status: She is alert and oriented to person, place, and time.  Psychiatric:        Mood and Affect: Mood normal.        Behavior: Behavior normal.        Thought Content: Thought content normal.        Judgment: Judgment normal.     Assessment & Plan:  Symptomatic mammary hypertrophy  Chronic bilateral thoracic back pain  Neck pain  The procedure the patient selected and that was best for the patient was discussed. The risk were discussed and include but not limited to the following:  Breast asymmetry, fluid accumulation, firmness of the breast, inability to breast feed, loss of nipple or areola, skin loss, change in skin and nipple sensation, fat necrosis of the breast tissue, bleeding, infection and healing delay.  There are risks of anesthesia and injury to nerves or blood vessels.  Allergic reaction to tape, suture and skin glue are possible.  There will be swelling.  Any of these can lead to the need for revisional surgery which is not included in this surgery.  A breast reduction has potential to interfere with diagnostic procedures in the future.  This procedure is best done when the breast is fully developed.  Changes in the breast will continue to occur over time: pregnancy, weight gain or weigh loss. No guarantees are given for a certain bra or breast size.    Total time: 40 minutes. This includes time spent with the patient during the visit as well as time spent before and after the visit reviewing the chart, documenting the encounter, ordering pertinent studies and literature for the patient.   Physical therapy:  ordered Mammogram:  ordered  The patient is a good candidate for bilateral breast reduction with liposuction.  We will get her set up with physical therapy and then see her back in 6 to 8 weeks.  The patient is in agreement.  Pictures were obtained of the patient and placed in the chart with  the patient's or guardian's permission.   Lindaann Requena Karo Rog, DO

## 2023-10-06 ENCOUNTER — Encounter: Payer: Self-pay | Admitting: Plastic Surgery

## 2023-10-18 ENCOUNTER — Ambulatory Visit (HOSPITAL_COMMUNITY): Payer: 59

## 2023-10-25 ENCOUNTER — Ambulatory Visit: Attending: Plastic Surgery | Admitting: Physical Therapy

## 2023-10-25 ENCOUNTER — Encounter: Payer: Self-pay | Admitting: Physical Therapy

## 2023-10-25 DIAGNOSIS — R293 Abnormal posture: Secondary | ICD-10-CM | POA: Diagnosis present

## 2023-10-25 DIAGNOSIS — M6281 Muscle weakness (generalized): Secondary | ICD-10-CM | POA: Diagnosis present

## 2023-10-25 DIAGNOSIS — M542 Cervicalgia: Secondary | ICD-10-CM | POA: Insufficient documentation

## 2023-10-25 DIAGNOSIS — N62 Hypertrophy of breast: Secondary | ICD-10-CM | POA: Diagnosis not present

## 2023-10-25 DIAGNOSIS — G8929 Other chronic pain: Secondary | ICD-10-CM | POA: Insufficient documentation

## 2023-10-25 DIAGNOSIS — M546 Pain in thoracic spine: Secondary | ICD-10-CM | POA: Insufficient documentation

## 2023-10-27 ENCOUNTER — Ambulatory Visit: Admitting: Physical Therapy

## 2023-10-29 NOTE — Therapy (Addendum)
 OUTPATIENT PHYSICAL THERAPY THORACOLUMBAR EVALUATION  Patient Name: Martha Grant MRN: [id number] DOB:[birth year], 43 y.o., female Today's Date: 10/25/2023  END OF SESSION:  PT End of Session - 10/29/23 1836     Visit Number 1    Number of Visits 6    Date for PT Re-Evaluation 12/06/23    PT Start Time 1641    PT Stop Time 1730    PT Time Calculation (min) 49 min    Activity Tolerance Patient tolerated treatment well    Behavior During Therapy [facility] for tasks assessed/performed             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   Past Surgical History:  Procedure Laterality Date   CESAREAN SECTION     x 2   LAPAROSCOPIC BILATERAL SALPINGO OOPHERECTOMY Bilateral    ULNA OSTEOTOMY Right 12/21/2013   Procedure: OPEN ULNAR SHORTENING OSTEOTOMY RIGHT ;  Surgeon: Nicki Reaper, MD;  Location: [facility];  Service: Orthopedics;  Laterality: Right;   WRIST ARTHROSCOPY WITH FOVEAL TRIANGULAR FIBROCARTILAGE COMPLEX REPAIR Right 12/21/2013   Procedure: ARTHROSCOPY DEBRIDEMENT TRIANGULAR CARTILAGE COMPLEX RIGHT WRIST;  Surgeon: Nicki Reaper, MD;  Location: [facility];  Service: Orthopedics;  Laterality: Right;   Patient Active Problem List   Diagnosis Date Noted   Symptomatic mammary hypertrophy 10/04/2023   Back pain 10/04/2023   Neck pain 10/04/2023   Hypertension 10/12/2022   Class 3 severe obesity without serious comorbidity with body mass index (BMI) of 40.0 to 44.9 in adult [facility]) 09/14/2021   Headache disorder 05/14/2019    PCP: Margarita Mail, DO  REFERRING PROVIDER: Peggye Form, DO  REFERRING DIAG:  N62 (ICD-10-CM) - Symptomatic mammary hypertrophy  M54.6,G89.29 (ICD-10-CM) - Chronic bilateral thoracic back pain  M54.2 (ICD-10-CM) - Neck pain    Rationale for Evaluation and Treatment:  Rehabilitation  THERAPY DIAG:  Pain in thoracic spine  Neck pain  Abnormal posture  Muscle weakness (generalized)  ONSET DATE: chronic  SUBJECTIVE:                                                                                                                                                                                           SUBJECTIVE STATEMENT: Pt. Reports chronic h/o neck/shoulder/back pain secondary to mammary hypertrophy/ posture abnormalities.  Pt. Has attended PT in past with minimal benefits.  Pt. Scheduled to f/u with Plastic Surgeon to determine need for breast reduction to improve back pain/ posture.  Pt. Lives with 33 y/o son.  No previous injections or back surgeries reported. Pt. Enjoys walking around track in [place].  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  No falls.    PERTINENT HISTORY:  1. Macromastia: Referral to plastic surgery placed to discuss breast reduction. Ibuprofen refilled.   - Ambulatory referral to Plastic Surgery - ibuprofen (ADVIL) 600 MG tablet; Take 1 tablet (600 mg total) by mouth every 8 (eight) hours as needed for moderate pain (pain score 4-6).  Dispense: 30 tablet; Refill: 3   Return in about 4 months (around 11/12/2023) for CPE - whichever provider .   Margarita Mail, DO  Pt. Reports she is a 40H bra size and was a size D prior to son being born 12 yrs. Ago.    PAIN:  Are you having pain? Yes: NPRS scale: 5/10 pain Pain location: mid-back/neck/shoulders Pain description: constant Aggravating factors: sleeping position/ standing tasks Relieving factors: nothing  PRECAUTIONS: None  RED FLAGS: None   WEIGHT BEARING RESTRICTIONS: No  FALLS:  Has patient fallen in last 6 months? 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   NEXT MD VISIT: 11/15/23  OBJECTIVE:  Note: Objective measures were completed at Evaluation unless otherwise noted.  DIAGNOSTIC FINDINGS:  See MD note (thoracic/lumbar spine imaging on 04/12/2019)  PATIENT SURVEYS:  Modified Oswestry 20% self-perceived mild disability.   COGNITION: Overall cognitive status: Within functional limits for tasks assessed     SENSATION: WFL  MUSCLE LENGTH: Hamstrings: Right: WFL, Left: WFL Thomas test: NT  POSTURE: rounded shoulders and forward head  PALPATION: Marked B UT muscle tightness/ tenderness with deep palpation.    LUMBAR ROM:   AROM eval  Flexion WNL  Extension WNL  Right lateral flexion WNL  Left lateral flexion WNL  Right rotation WNL  Left rotation WNL   (Blank rows = not tested)  LOWER EXTREMITY ROM:     Cervical AROM WNL (all planes) B shoulder AROM WNL (all planes).  B shoulder flexion/ abduction 4+/5 MMT B LE AROM WFL (all planes).  B LE strength grossly 5/5 MMT except hip flexion 4+/5 MMT  LUMBAR SPECIAL TESTS:  Straight leg raise test: Negative, Slump test: Negative, and FABER test: Negative  FUNCTIONAL TESTS:  NT  GAIT: Distance walked: in clinic Assistive device utilized: None Level of assistance: Complete Independence Comments: Rounded sh./forward head posture with gait.  TREATMENT DATE: 10/25/23                                                                                                                               See evaluation/ HEP.    PATIENT EDUCATION:  Education details: Access Code: [email or web address] Person educated: Patient Education method: Medical illustrator Education comprehension: verbalized understanding  and returned demonstration  HOME EXERCISE PROGRAM: Access Code: [email or web address] URL: [email or web address] Date: 10/25/2023 Prepared by: Dorene Grebe Exercises - Scapular Retraction with Resistance - 1 x daily - 4 x weekly - 2 sets - 15 reps -  Shoulder W - External Rotation with Resistance - 1 x daily - 4 x weekly - 2 sets - 15 reps - Doorway Pec Stretch at 90 Degrees Abduction - 1 x daily - 7 x weekly - 1 sets - 5 reps - 20 seconds hold - Standing Lumbar Extension at Wall - Forearms - 1 x daily - 7 x weekly - 1 sets - 5 reps - 10 seconds hold   ASSESSMENT:  CLINICAL IMPRESSION: Patient is a pleasant 43 y.o. female who was seen today for physical therapy evaluation and treatment for chronic back/neck/shoulder pain.  Pt. Reports 5/10 back/neck/sh. Pain and presents with strength/ posture deficits.  Pt. Will benefit from short-term skilled PT services to develop ex. Program to improve pain-free work-related/functional tasks.   OBJECTIVE IMPAIRMENTS: decreased activity tolerance, decreased endurance, decreased mobility, decreased ROM, decreased strength, hypomobility, impaired flexibility, improper body mechanics, postural dysfunction, and pain.   ACTIVITY LIMITATIONS: carrying, lifting, and standing  PARTICIPATION LIMITATIONS: cleaning and occupation  PERSONAL FACTORS: Fitness and Past/current experiences are also affecting patient's functional outcome.   REHAB POTENTIAL: Good  CLINICAL DECISION MAKING: Stable/uncomplicated  EVALUATION COMPLEXITY: Low   GOALS: Goals reviewed with patient? Yes  SHORT TERM GOALS: Target date: 11/08/23  Pt. Independent with HEP to increase upright posture/ UE muscle strength to improve pain-free mobility.   Baseline:  HEP issued.   Goal status: INITIAL  LONG TERM GOALS: Target date: 12/06/23  Pt. Will decrease MODI to <10% to improve back pain with daily tasks.   Baseline:  20% MODI Goal status: INITIAL  2.  Pt. Will reports 3/10 back/neck pain at worst with work-related/ home tasks.  Baseline:  >5/10 back pain at rest.   Goal status: INITIAL  3.  Pt. Able to walk on [place] track for 30 minutes with no increase c/o pain to improve functional mobility.   Baseline: pain limited.  Goal  status: INITIAL  PLAN:  PT FREQUENCY: 1-2x/week  PT DURATION: 6 weeks  PLANNED INTERVENTIONS: 97110-Therapeutic exercises, 97530- Therapeutic activity, 97112- Neuromuscular re-education, 97535- Self Care, 54098- Manual therapy, G0283- Electrical stimulation (unattended), [phone]- Electrical stimulation (manual), Patient/Family education, Taping, Dry Needling, Joint mobilization, Joint manipulation, Spinal manipulation, Spinal mobilization, Cryotherapy, and Moist heat.  PLAN FOR NEXT SESSION: Review/ progress HEP  Cammie Mcgee, PT, DPT # [id number] 10/29/2023, 6:39 PM

## 2023-10-31 ENCOUNTER — Ambulatory Visit: Admitting: Physical Therapy

## 2023-11-03 ENCOUNTER — Ambulatory Visit: Admitting: Physical Therapy

## 2023-11-03 DIAGNOSIS — M542 Cervicalgia: Secondary | ICD-10-CM

## 2023-11-03 DIAGNOSIS — M6281 Muscle weakness (generalized): Secondary | ICD-10-CM

## 2023-11-03 DIAGNOSIS — M546 Pain in thoracic spine: Secondary | ICD-10-CM | POA: Diagnosis not present

## 2023-11-03 DIAGNOSIS — R293 Abnormal posture: Secondary | ICD-10-CM

## 2023-11-06 NOTE — Therapy (Signed)
 OUTPATIENT PHYSICAL THERAPY THORACOLUMBAR TREATMENT  Patient Name: Martha Grant MRN: [id number] DOB:[birth year], 43 y.o., female Today's Date: 11/03/2023  END OF SESSION:  PT End of Session - 11/06/23 1908     Visit Number 2    Number of Visits 6    Date for PT Re-Evaluation 12/06/23    PT Start Time 1639    PT Stop Time 1730    PT Time Calculation (min) 51 min    Activity Tolerance Patient tolerated treatment well    Behavior During Therapy [facility] for tasks assessed/performed             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   Past Surgical History:  Procedure Laterality Date   CESAREAN SECTION     x 2   LAPAROSCOPIC BILATERAL SALPINGO OOPHERECTOMY Bilateral    ULNA OSTEOTOMY Right 12/21/2013   Procedure: OPEN ULNAR SHORTENING OSTEOTOMY RIGHT ;  Surgeon: Nicki Reaper, MD;  Location: [facility];  Service: Orthopedics;  Laterality: Right;   WRIST ARTHROSCOPY WITH FOVEAL TRIANGULAR FIBROCARTILAGE COMPLEX REPAIR Right 12/21/2013   Procedure: ARTHROSCOPY DEBRIDEMENT TRIANGULAR CARTILAGE COMPLEX RIGHT WRIST;  Surgeon: Nicki Reaper, MD;  Location: [facility];  Service: Orthopedics;  Laterality: Right;   Patient Active Problem List   Diagnosis Date Noted   Symptomatic mammary hypertrophy 10/04/2023   Back pain 10/04/2023   Neck pain 10/04/2023   Hypertension 10/12/2022   Class 3 severe obesity without serious comorbidity with body mass index (BMI) of 40.0 to 44.9 in adult [facility]) 09/14/2021   Headache disorder 05/14/2019    PCP: Margarita Mail, DO  REFERRING PROVIDER: Peggye Form, DO  REFERRING DIAG:  N62 (ICD-10-CM) - Symptomatic mammary hypertrophy  M54.6,G89.29 (ICD-10-CM) - Chronic bilateral thoracic back pain  M54.2 (ICD-10-CM) - Neck pain    Rationale for Evaluation and Treatment:  Rehabilitation  THERAPY DIAG:  Pain in thoracic spine  Neck pain  Abnormal posture  Muscle weakness (generalized)  ONSET DATE: chronic  SUBJECTIVE:                                                                                                                                                                                           SUBJECTIVE STATEMENT: Pt. Reports chronic h/o neck/shoulder/back pain secondary to mammary hypertrophy/ posture abnormalities.  Pt. Has attended PT in past with minimal benefits.  Pt. Scheduled to f/u with Plastic Surgeon to determine need for breast reduction to improve back pain/ posture.  Pt. Lives with 26 y/o son.  No previous injections or back surgeries reported. Pt. Enjoys walking around track in [place].  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  No falls.    PERTINENT HISTORY:  1. Macromastia: Referral to plastic surgery placed to discuss breast reduction. Ibuprofen refilled.   - Ambulatory referral to Plastic Surgery - ibuprofen (ADVIL) 600 MG tablet; Take 1 tablet (600 mg total) by mouth every 8 (eight) hours as needed for moderate pain (pain score 4-6).  Dispense: 30 tablet; Refill: 3   Return in about 4 months (around 11/12/2023) for CPE - whichever provider .   Margarita Mail, DO  Pt. Reports she is a 40H bra size and was a size D prior to son being born 12 yrs. Ago.    PAIN:  Are you having pain? Yes: NPRS scale: 5/10 pain Pain location: mid-back/neck/shoulders Pain description: constant Aggravating factors: sleeping position/ standing tasks Relieving factors: nothing  PRECAUTIONS: None  RED FLAGS: None   WEIGHT BEARING RESTRICTIONS: No  FALLS:  Has patient fallen in last 6 months? 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   NEXT MD VISIT: 11/15/23  OBJECTIVE:  Note: Objective measures were completed at Evaluation unless otherwise noted.  DIAGNOSTIC FINDINGS:  See MD note (thoracic/lumbar spine imaging on 04/12/2019)  PATIENT SURVEYS:  Modified Oswestry 20% self-perceived mild disability.   COGNITION: Overall cognitive status: Within functional limits for tasks assessed     SENSATION: WFL  MUSCLE LENGTH: Hamstrings: Right: WFL, Left: WFL Thomas test: NT  POSTURE: rounded shoulders and forward head  PALPATION: Marked B UT muscle tightness/ tenderness with deep palpation.    LUMBAR ROM:   AROM eval  Flexion WNL  Extension WNL  Right lateral flexion WNL  Left lateral flexion WNL  Right rotation WNL  Left rotation WNL   (Blank rows = not tested)  LOWER EXTREMITY ROM:     Cervical AROM WNL (all planes) B shoulder AROM WNL (all planes).  B shoulder flexion/ abduction 4+/5 MMT B LE AROM WFL (all planes).  B LE strength grossly 5/5 MMT except hip flexion 4+/5 MMT  LUMBAR SPECIAL TESTS:  Straight leg raise test: Negative, Slump test: Negative, and FABER test: Negative  FUNCTIONAL TESTS:  NT  GAIT: Distance walked: in clinic Assistive device utilized: None Level of assistance: Complete Independence Comments: Rounded sh./forward head posture with gait.  TREATMENT DATE: 11/03/23                                                                                                                              Subjective:  Pt. Reports no new complaints.  Pt. Reports persistent back pain with daily tasks.    There.ex.:  Reviewed HEP.  Added RTB diagonals  with upright posture.    Manual tx.:   Supine LE/lumbar generalized stretches.  Supine B shoulder flexion/ horizontal abduction (pec stretches).    Seated STM to upper/mid-back paraspinals (use of Hypervolt).  Prone grade II-III PA unilateral mobs (thoracic/lumbar spine)- 2x20 sec. (As tolerated).    Prone STM with  use of Hypervolt (instructed for home use).     No change to HEP  PATIENT EDUCATION:  Education details: Access Code: [phone] Person educated: Patient Education method: Medical illustrator Education comprehension: verbalized understanding and returned demonstration  HOME EXERCISE PROGRAM: Access Code: [email or web address] URL: [email or web address] Date: 10/25/2023 Prepared by: Dorene Grebe Exercises - Scapular Retraction with Resistance - 1 x daily - 4 x weekly - 2 sets - 15 reps - Shoulder W - External Rotation with Resistance - 1 x daily - 4 x weekly - 2 sets - 15 reps - Doorway Pec Stretch at 90 Degrees Abduction - 1 x daily - 7 x weekly - 1 sets - 5 reps - 20 seconds hold - Standing Lumbar Extension at Wall - Forearms - 1 x daily - 7 x weekly - 1 sets - 5 reps - 10 seconds hold   ASSESSMENT:  CLINICAL IMPRESSION: Pt. Reports 5/10 back/neck/sh. Pain and presents with strength/ posture deficits.  Pt. Demonstrates good technique with HEP and no changes to HEP at this time. Moderate thoracic spine hypomobility with mobs./ STM.   Pt. Will benefit from short-term skilled PT services to develop ex. Program to improve pain-free work-related/functional tasks.   OBJECTIVE IMPAIRMENTS: decreased activity tolerance, decreased endurance, decreased mobility, decreased ROM, decreased strength, hypomobility, impaired flexibility, improper body mechanics, postural dysfunction, and pain.   ACTIVITY LIMITATIONS: carrying, lifting, and standing  PARTICIPATION LIMITATIONS: cleaning and occupation  PERSONAL FACTORS: Fitness and Past/current experiences are also affecting patient's functional outcome.   REHAB POTENTIAL: Good  CLINICAL DECISION MAKING: Stable/uncomplicated  EVALUATION COMPLEXITY: Low   GOALS: Goals reviewed with patient? Yes  SHORT TERM GOALS: Target date: 11/08/23  Pt. Independent with HEP to increase upright posture/ UE muscle strength to improve pain-free mobility.    Baseline:  HEP issued.   Goal status: INITIAL  LONG TERM GOALS: Target date: 12/06/23  Pt. Will decrease MODI to <10% to improve back pain with daily tasks.   Baseline:  20% MODI Goal status: INITIAL  2.  Pt. Will reports 3/10 back/neck pain at worst with work-related/ home tasks.  Baseline:  >5/10 back pain at rest.   Goal status: INITIAL  3.  Pt. Able to walk on [place] track for 30 minutes with no increase c/o pain to improve functional mobility.   Baseline: pain limited.  Goal status: INITIAL  PLAN:  PT FREQUENCY: 1-2x/week  PT DURATION: 6 weeks  PLANNED INTERVENTIONS: 97110-Therapeutic exercises, 97530- Therapeutic activity, 97112- Neuromuscular re-education, 97535- Self Care, 14782- Manual therapy, G0283- Electrical stimulation (unattended), [phone]- Electrical stimulation (manual), Patient/Family education, Taping, Dry Needling, Joint mobilization, Joint manipulation, Spinal manipulation, Spinal mobilization, Cryotherapy, and Moist heat.  PLAN FOR NEXT SESSION: Review/ progress HEP  Cammie Mcgee, PT, DPT # [phone] 11/06/2023, 7:09 PM

## 2023-11-06 NOTE — Addendum Note (Signed)
 Addended by: Cammie Mcgee on: 11/06/2023 07:06 PM   Modules accepted: Orders

## 2023-11-07 ENCOUNTER — Ambulatory Visit: Admitting: Physical Therapy

## 2023-11-10 ENCOUNTER — Encounter: Payer: Self-pay | Admitting: Physical Therapy

## 2023-11-10 ENCOUNTER — Ambulatory Visit: Attending: Plastic Surgery | Admitting: Physical Therapy

## 2023-11-10 DIAGNOSIS — R293 Abnormal posture: Secondary | ICD-10-CM | POA: Insufficient documentation

## 2023-11-10 DIAGNOSIS — M546 Pain in thoracic spine: Secondary | ICD-10-CM | POA: Diagnosis present

## 2023-11-10 DIAGNOSIS — M542 Cervicalgia: Secondary | ICD-10-CM | POA: Insufficient documentation

## 2023-11-10 DIAGNOSIS — M6281 Muscle weakness (generalized): Secondary | ICD-10-CM | POA: Insufficient documentation

## 2023-11-10 NOTE — Therapy (Signed)
 OUTPATIENT PHYSICAL THERAPY THORACOLUMBAR TREATMENT  Patient Name: Martha Grant MRN: [id number] DOB:1981/02/13, 43 y.o., female Today's Date: 11/11/2023  END OF SESSION:  PT End of Session - 11/10/23 1640     Visit Number 3    Number of Visits 6    Date for PT Re-Evaluation 12/06/23    PT Start Time 1640    PT Stop Time 1725    PT Time Calculation (min) 45 min    Activity Tolerance Patient tolerated treatment well    Behavior During Therapy [facility] for tasks assessed/performed             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   Past Surgical History:  Procedure Laterality Date   CESAREAN SECTION     x 2   LAPAROSCOPIC BILATERAL SALPINGO OOPHERECTOMY Bilateral    ULNA OSTEOTOMY Right 12/21/2013   Procedure: OPEN ULNAR SHORTENING OSTEOTOMY RIGHT ;  Surgeon: Nicki Reaper, MD;  Location: [facility];  Service: Orthopedics;  Laterality: Right;   WRIST ARTHROSCOPY WITH FOVEAL TRIANGULAR FIBROCARTILAGE COMPLEX REPAIR Right 12/21/2013   Procedure: ARTHROSCOPY DEBRIDEMENT TRIANGULAR CARTILAGE COMPLEX RIGHT WRIST;  Surgeon: Nicki Reaper, MD;  Location: [facility];  Service: Orthopedics;  Laterality: Right;   Patient Active Problem List   Diagnosis Date Noted   Symptomatic mammary hypertrophy 10/04/2023   Back pain 10/04/2023   Neck pain 10/04/2023   Hypertension 10/12/2022   Class 3 severe obesity without serious comorbidity with body mass index (BMI) of 40.0 to 44.9 in adult [facility]) 09/14/2021   Headache disorder 05/14/2019    PCP: Margarita Mail, DO  REFERRING PROVIDER: Peggye Form, DO  REFERRING DIAG:  N62 (ICD-10-CM) - Symptomatic mammary hypertrophy  M54.6,G89.29 (ICD-10-CM) - Chronic bilateral thoracic back pain  M54.2 (ICD-10-CM) - Neck pain    Rationale for Evaluation and Treatment: Rehabilitation  THERAPY  DIAG:  Pain in thoracic spine  Neck pain  Abnormal posture  Muscle weakness (generalized)  ONSET DATE: chronic  SUBJECTIVE:                                                                                                                                                                                           SUBJECTIVE STATEMENT: Pt. Reports chronic h/o neck/shoulder/back pain secondary to mammary hypertrophy/ posture abnormalities.  Pt. Has attended PT in past with minimal benefits.  Pt. Scheduled to f/u with Plastic Surgeon to determine need for breast reduction to improve back pain/ posture.  Pt. Lives with 39 y/o son.  No previous injections or back surgeries reported. Pt. Enjoys walking around track in [place].  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  No falls.    PERTINENT HISTORY:  1. Macromastia: Referral to plastic surgery placed to discuss breast reduction. Ibuprofen refilled.   - Ambulatory referral to Plastic Surgery - ibuprofen (ADVIL) 600 MG tablet; Take 1 tablet (600 mg total) by mouth every 8 (eight) hours as needed for moderate pain (pain score 4-6).  Dispense: 30 tablet; Refill: 3   Return in about 4 months (around 11/12/2023) for CPE - whichever provider .   Margarita Mail, DO  Pt. Reports she is a 40H bra size and was a size D prior to son being born 12 yrs. Ago.    PAIN:  Are you having pain? Yes: NPRS scale: 5/10 pain Pain location: mid-back/neck/shoulders Pain description: constant Aggravating factors: sleeping position/ standing tasks Relieving factors: nothing  PRECAUTIONS: None  RED FLAGS: None   WEIGHT BEARING RESTRICTIONS: No  FALLS:  Has patient fallen in last 6 months? 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    NEXT MD VISIT: 11/15/23  OBJECTIVE:  Note: Objective measures were completed at Evaluation unless otherwise noted.  DIAGNOSTIC FINDINGS:  See MD note (thoracic/lumbar spine imaging on 04/12/2019)  PATIENT SURVEYS:  Modified Oswestry 20% self-perceived mild disability.   COGNITION: Overall cognitive status: Within functional limits for tasks assessed     SENSATION: WFL  MUSCLE LENGTH: Hamstrings: Right: WFL, Left: WFL Thomas test: NT  POSTURE: rounded shoulders and forward head  PALPATION: Marked B UT muscle tightness/ tenderness with deep palpation.    LUMBAR ROM:   AROM eval  Flexion WNL  Extension WNL  Right lateral flexion WNL  Left lateral flexion WNL  Right rotation WNL  Left rotation WNL   (Blank rows = not tested)  LOWER EXTREMITY ROM:     Cervical AROM WNL (all planes) B shoulder AROM WNL (all planes).  B shoulder flexion/ abduction 4+/5 MMT B LE AROM WFL (all planes).  B LE strength grossly 5/5 MMT except hip flexion 4+/5 MMT  LUMBAR SPECIAL TESTS:  Straight leg raise test: Negative, Slump test: Negative, and FABER test: Negative  FUNCTIONAL TESTS:  NT  GAIT: Distance walked: in clinic Assistive device utilized: None Level of assistance: Complete Independence Comments: Rounded sh./forward head posture with gait.  TREATMENT DATE: 11/11/2023                                                                                                                            Subjective:  Pt. Reports no new complaints.  Pt. Reports persistent back pain with daily tasks (more upper than lower back symptoms).  Pt. Has f/u with surgeon next  Tuesday.    Manual tx.:   Supine LE/lumbar generalized stretches.  Supine B shoulder flexion/ horizontal abduction (pec stretches).  Cervical (UT/ levator/ rotn.) in supine.   Seated STM to upper/mid-back paraspinals (use of Hypervolt).  Prone grade II-III PA unilateral mobs (thoracic/lumbar spine)- 2x20 sec. (As tolerated).     Prone STM with use of Hypervolt (instructed for home use).     No change to HEP  There.ex.:  Reviewed HEP.  Reassessment of B shoulder MMT.  4+/5 sh. Flexion/ abduction  PATIENT EDUCATION:  Education details: Access Code: [phone] Person educated: Patient Education method: Medical illustrator Education comprehension: verbalized understanding and returned demonstration  HOME EXERCISE PROGRAM: Access Code: [email or web address] URL: [email or web address] Date: 10/25/2023 Prepared by: Dorene Grebe Exercises - Scapular Retraction with Resistance - 1 x daily - 4 x weekly - 2 sets - 15 reps - Shoulder W - External Rotation with Resistance - 1 x daily - 4 x weekly - 2 sets - 15 reps - Doorway Pec Stretch at 90 Degrees Abduction - 1 x daily - 7 x weekly - 1 sets - 5 reps - 20 seconds hold - Standing Lumbar Extension at Wall - Forearms - 1 x daily - 7 x weekly - 1 sets - 5 reps - 10 seconds hold   ASSESSMENT:  CLINICAL IMPRESSION: Pt. Demonstrates good technique with HEP and no changes to HEP at this time.  Pt. Presents with moderate thoracic spine hypomobility with mobs./ STM.  Pt. Limited by upper back pain with increase activity/ repetitive tasks at home.  Pt. Encouraged to continue with HEP and be aware of upright posture with work and home activities.  Pt. Will benefit from short-term skilled PT services to develop ex. Program to improve pain-free work-related/functional tasks.   OBJECTIVE IMPAIRMENTS: decreased activity tolerance, decreased endurance, decreased mobility, decreased ROM, decreased strength, hypomobility, impaired flexibility, improper body mechanics, postural dysfunction, and pain.   ACTIVITY LIMITATIONS: carrying, lifting, and standing  PARTICIPATION LIMITATIONS: cleaning and occupation  PERSONAL FACTORS: Fitness and Past/current experiences are also affecting patient's functional outcome.   REHAB POTENTIAL: Good  CLINICAL DECISION MAKING:  Stable/uncomplicated  EVALUATION COMPLEXITY: Low   GOALS: Goals reviewed with patient? Yes  SHORT TERM GOALS: Target date: 11/08/23  Pt. Independent with HEP to increase upright posture/ UE muscle strength to improve pain-free mobility.   Baseline:  HEP issued.   Goal status: INITIAL  LONG TERM GOALS: Target date: 12/06/23  Pt. Will decrease MODI to <10% to improve back pain with daily tasks.   Baseline:  20% MODI Goal status: INITIAL  2.  Pt. Will reports 3/10 back/neck pain at worst with work-related/ home tasks.  Baseline:  >5/10 back pain at rest.   Goal status: INITIAL  3.  Pt. Able to walk on [place] track for 30 minutes with no increase c/o pain to improve functional mobility.   Baseline: pain limited.  Goal status: INITIAL  PLAN:  PT FREQUENCY: 1-2x/week  PT DURATION: 6 weeks  PLANNED INTERVENTIONS: 97110-Therapeutic exercises, 97530- Therapeutic activity, 97112- Neuromuscular re-education, 97535- Self Care, 36644- Manual therapy, G0283- Electrical stimulation (unattended), [phone]- Electrical stimulation (manual), Patient/Family education, Taping, Dry Needling, Joint mobilization, Joint manipulation, Spinal manipulation, Spinal mobilization, Cryotherapy, and Moist heat.  PLAN FOR NEXT SESSION: Send MD progress note  Cammie Mcgee, PT, DPT # [phone] 11/11/2023, 5:17 PM

## 2023-11-14 ENCOUNTER — Ambulatory Visit: Admitting: Physical Therapy

## 2023-11-14 ENCOUNTER — Telehealth: Payer: Self-pay

## 2023-11-14 NOTE — Telephone Encounter (Signed)
 Called patient to reschedule her from tomorrow to a different day or after her PT appointment. Also sent patient a message to reschedule and tomorrows appointment is not needed due to still seeing PT. Schedule after PT appointments have been completed.

## 2023-11-15 ENCOUNTER — Ambulatory Visit: Payer: 59 | Admitting: Student

## 2023-11-15 ENCOUNTER — Ambulatory Visit: Admitting: Physical Therapy

## 2023-11-15 DIAGNOSIS — R293 Abnormal posture: Secondary | ICD-10-CM

## 2023-11-15 DIAGNOSIS — M542 Cervicalgia: Secondary | ICD-10-CM

## 2023-11-15 DIAGNOSIS — M6281 Muscle weakness (generalized): Secondary | ICD-10-CM

## 2023-11-15 DIAGNOSIS — M546 Pain in thoracic spine: Secondary | ICD-10-CM | POA: Diagnosis not present

## 2023-11-16 ENCOUNTER — Encounter: Payer: Self-pay | Admitting: Physical Therapy

## 2023-11-16 NOTE — Therapy (Signed)
 OUTPATIENT PHYSICAL THERAPY THORACOLUMBAR TREATMENT  Patient Name: Martha Grant MRN: [id number] DOB:[birth year], 43 y.o., female Today's Date: 11/16/2023  END OF SESSION:  PT End of Session - 11/16/23 0934     Visit Number 4    Number of Visits 6    Date for PT Re-Evaluation 12/06/23    PT Start Time 1144    PT Stop Time 1220    PT Time Calculation (min) 36 min    Activity Tolerance Patient tolerated treatment well    Behavior During Therapy [facility] for tasks assessed/performed             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   Past Surgical History:  Procedure Laterality Date   CESAREAN SECTION     x 2   LAPAROSCOPIC BILATERAL SALPINGO OOPHERECTOMY Bilateral    ULNA OSTEOTOMY Right 12/21/2013   Procedure: OPEN ULNAR SHORTENING OSTEOTOMY RIGHT ;  Surgeon: Nicki Reaper, MD;  Location: [facility];  Service: Orthopedics;  Laterality: Right;   WRIST ARTHROSCOPY WITH FOVEAL TRIANGULAR FIBROCARTILAGE COMPLEX REPAIR Right 12/21/2013   Procedure: ARTHROSCOPY DEBRIDEMENT TRIANGULAR CARTILAGE COMPLEX RIGHT WRIST;  Surgeon: Nicki Reaper, MD;  Location: [facility];  Service: Orthopedics;  Laterality: Right;   Patient Active Problem List   Diagnosis Date Noted   Symptomatic mammary hypertrophy 10/04/2023   Back pain 10/04/2023   Neck pain 10/04/2023   Hypertension 10/12/2022   Class 3 severe obesity without serious comorbidity with body mass index (BMI) of 40.0 to 44.9 in adult [facility]) 09/14/2021   Headache disorder 05/14/2019    PCP: Margarita Mail, DO  REFERRING PROVIDER: Peggye Form, DO  REFERRING DIAG:  N62 (ICD-10-CM) - Symptomatic mammary hypertrophy  M54.6,G89.29 (ICD-10-CM) - Chronic bilateral thoracic back pain  M54.2 (ICD-10-CM) - Neck pain    Rationale for Evaluation and Treatment: Rehabilitation  THERAPY  DIAG:  Pain in thoracic spine  Neck pain  Abnormal posture  Muscle weakness (generalized)  ONSET DATE: chronic  SUBJECTIVE:                                                                                                                                                                                           SUBJECTIVE STATEMENT: Pt. Reports chronic h/o neck/shoulder/back pain secondary to mammary hypertrophy/ posture abnormalities.  Pt. Has attended PT in past with minimal benefits.  Pt. Scheduled to f/u with Plastic Surgeon to determine need for breast reduction to improve back pain/ posture.  Pt. Lives with 76 y/o son.  No previous injections or back surgeries reported. Pt. Enjoys walking around track in [place].  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  No falls.    PERTINENT HISTORY:  1. Macromastia: Referral to plastic surgery placed to discuss breast reduction. Ibuprofen refilled.   - Ambulatory referral to Plastic Surgery - ibuprofen (ADVIL) 600 MG tablet; Take 1 tablet (600 mg total) by mouth every 8 (eight) hours as needed for moderate pain (pain score 4-6).  Dispense: 30 tablet; Refill: 3   Return in about 4 months (around 11/12/2023) for CPE - whichever provider .   Margarita Mail, DO  Pt. Reports she is a 40H bra size and was a size D prior to son being born 12 yrs. Ago.    PAIN:  Are you having pain? Yes: NPRS scale: 5/10 pain Pain location: mid-back/neck/shoulders Pain description: constant Aggravating factors: sleeping position/ standing tasks Relieving factors: nothing  PRECAUTIONS: None  RED FLAGS: None   WEIGHT BEARING RESTRICTIONS: No  FALLS:  Has patient fallen in last 6 months? 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    NEXT MD VISIT: 11/15/23  OBJECTIVE:  Note: Objective measures were completed at Evaluation unless otherwise noted.  DIAGNOSTIC FINDINGS:  See MD note (thoracic/lumbar spine imaging on 04/12/2019)  PATIENT SURVEYS:  Modified Oswestry 20% self-perceived mild disability.   COGNITION: Overall cognitive status: Within functional limits for tasks assessed     SENSATION: WFL  MUSCLE LENGTH: Hamstrings: Right: WFL, Left: WFL Thomas test: NT  POSTURE: rounded shoulders and forward head  PALPATION: Marked B UT muscle tightness/ tenderness with deep palpation.    LUMBAR ROM:   AROM eval  Flexion WNL  Extension WNL  Right lateral flexion WNL  Left lateral flexion WNL  Right rotation WNL  Left rotation WNL   (Blank rows = not tested)  LOWER EXTREMITY ROM:     Cervical AROM WNL (all planes) B shoulder AROM WNL (all planes).  B shoulder flexion/ abduction 4+/5 MMT B LE AROM WFL (all planes).  B LE strength grossly 5/5 MMT except hip flexion 4+/5 MMT  LUMBAR SPECIAL TESTS:  Straight leg raise test: Negative, Slump test: Negative, and FABER test: Negative  FUNCTIONAL TESTS:  NT  GAIT: Distance walked: in clinic Assistive device utilized: None Level of assistance: Complete Independence Comments: Rounded sh./forward head posture with gait.  TREATMENT DATE: 11/16/2023                                                                                                                            Subjective:  Pt. Reports no new complaints.  Pt. Reports persistent back pain with daily tasks (more upper than lower back symptoms).  Pt. Has f/u with surgeon next  Tuesday.    Manual tx.:   Supine LE/lumbar generalized stretches.  Supine B shoulder flexion/ horizontal abduction (pec stretches).  Cervical (UT/ levator/ rotn.) in supine.   Seated STM to upper/mid-back paraspinals (use of Hypervolt).  Prone grade II-III PA unilateral mobs (thoracic/lumbar spine)- 2x20 sec. (As tolerated).     Prone STM with use of Hypervolt (instructed for home use).     No change to HEP  There.ex.:  Reviewed HEP.  Reassessment of B shoulder MMT.  4+/5 sh. Flexion/ abduction  PATIENT EDUCATION:  Education details: Access Code: [email or web address] Person educated: Patient Education method: Medical illustrator Education comprehension: verbalized understanding and returned demonstration  HOME EXERCISE PROGRAM: Access Code: [email or web address] URL: [email or web address] Date: 10/25/2023 Prepared by: Dorene Grebe Exercises - Scapular Retraction with Resistance - 1 x daily - 4 x weekly - 2 sets - 15 reps - Shoulder W - External Rotation with Resistance - 1 x daily - 4 x weekly - 2 sets - 15 reps - Doorway Pec Stretch at 90 Degrees Abduction - 1 x daily - 7 x weekly - 1 sets - 5 reps - 20 seconds hold - Standing Lumbar Extension at Wall - Forearms - 1 x daily - 7 x weekly - 1 sets - 5 reps - 10 seconds hold   ASSESSMENT:  CLINICAL IMPRESSION: Pt. Demonstrates good technique with HEP and no changes to HEP at this time.  Pt. Presents with moderate thoracic spine hypomobility with mobs./ STM.  Pt. Limited by upper back pain with increase activity/ repetitive tasks at home.  Pt. Encouraged to continue with HEP and be aware of upright posture with work and home activities.  Pt. Will benefit from short-term skilled PT services to develop ex. Program to improve pain-free work-related/functional tasks.   OBJECTIVE IMPAIRMENTS: decreased activity tolerance, decreased endurance, decreased mobility, decreased ROM, decreased strength, hypomobility, impaired flexibility, improper body mechanics, postural dysfunction, and pain.   ACTIVITY LIMITATIONS: carrying, lifting, and standing  PARTICIPATION LIMITATIONS: cleaning and occupation  PERSONAL FACTORS: Fitness and Past/current experiences are also affecting patient's functional outcome.   REHAB POTENTIAL: Good  CLINICAL DECISION MAKING:  Stable/uncomplicated  EVALUATION COMPLEXITY: Low   GOALS: Goals reviewed with patient? Yes  SHORT TERM GOALS: Target date: 11/08/23  Pt. Independent with HEP to increase upright posture/ UE muscle strength to improve pain-free mobility.   Baseline:  HEP issued.   Goal status: INITIAL  LONG TERM GOALS: Target date: 12/06/23  Pt. Will decrease MODI to <10% to improve back pain with daily tasks.   Baseline:  20% MODI Goal status: INITIAL  2.  Pt. Will reports 3/10 back/neck pain at worst with work-related/ home tasks.  Baseline:  >5/10 back pain at rest.   Goal status: INITIAL  3.  Pt. Able to walk on [place] track for 30 minutes with no increase c/o pain to improve functional mobility.   Baseline: pain limited.  Goal status: INITIAL  PLAN:  PT FREQUENCY: 1-2x/week  PT DURATION: 6 weeks  PLANNED INTERVENTIONS: 97110-Therapeutic exercises, 97530- Therapeutic activity, 97112- Neuromuscular re-education, 97535- Self Care, 14782- Manual therapy, G0283- Electrical stimulation (unattended), [phone]- Electrical stimulation (manual), Patient/Family education, Taping, Dry Needling, Joint mobilization, Joint manipulation, Spinal manipulation, Spinal mobilization, Cryotherapy, and Moist heat.  PLAN FOR NEXT SESSION: Send MD progress note  Cammie Mcgee, PT, DPT # [phone] 11/16/2023, 9:35 AM

## 2023-11-17 ENCOUNTER — Ambulatory Visit: Admitting: Physical Therapy

## 2023-11-17 DIAGNOSIS — M6281 Muscle weakness (generalized): Secondary | ICD-10-CM

## 2023-11-17 DIAGNOSIS — M546 Pain in thoracic spine: Secondary | ICD-10-CM

## 2023-11-17 DIAGNOSIS — M542 Cervicalgia: Secondary | ICD-10-CM

## 2023-11-17 DIAGNOSIS — R293 Abnormal posture: Secondary | ICD-10-CM

## 2023-11-20 NOTE — Therapy (Signed)
 OUTPATIENT PHYSICAL THERAPY THORACOLUMBAR TREATMENT  Patient Name: Martha Grant MRN: [id number] DOB:[birth year], 43 y.o., female Today's Date: 11/17/2023  END OF SESSION:  PT End of Session - 11/20/23 1956     Visit Number 5    Number of Visits 6    Date for PT Re-Evaluation 12/06/23    PT Start Time 1641    PT Stop Time 1730    PT Time Calculation (min) 49 min    Activity Tolerance Patient tolerated treatment well    Behavior During Therapy [facility] for tasks assessed/performed             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   Past Surgical History:  Procedure Laterality Date   CESAREAN SECTION     x 2   LAPAROSCOPIC BILATERAL SALPINGO OOPHERECTOMY Bilateral    ULNA OSTEOTOMY Right 12/21/2013   Procedure: OPEN ULNAR SHORTENING OSTEOTOMY RIGHT ;  Surgeon: Kemp Patter, MD;  Location: [facility];  Service: Orthopedics;  Laterality: Right;   WRIST ARTHROSCOPY WITH FOVEAL TRIANGULAR FIBROCARTILAGE COMPLEX REPAIR Right 12/21/2013   Procedure: ARTHROSCOPY DEBRIDEMENT TRIANGULAR CARTILAGE COMPLEX RIGHT WRIST;  Surgeon: Kemp Patter, MD;  Location: [facility];  Service: Orthopedics;  Laterality: Right;   Patient Active Problem List   Diagnosis Date Noted   Symptomatic mammary hypertrophy 10/04/2023   Back pain 10/04/2023   Neck pain 10/04/2023   Hypertension 10/12/2022   Class 3 severe obesity without serious comorbidity with body mass index (BMI) of 40.0 to 44.9 in adult [facility]) 09/14/2021   Headache disorder 05/14/2019    PCP: Rockney Cid, DO  REFERRING PROVIDER: Thornell Flirt, DO  REFERRING DIAG:  N62 (ICD-10-CM) - Symptomatic mammary hypertrophy  M54.6,G89.29 (ICD-10-CM) - Chronic bilateral thoracic back pain  M54.2 (ICD-10-CM) - Neck pain    Rationale for Evaluation and Treatment:  Rehabilitation  THERAPY DIAG:  Pain in thoracic spine  Neck pain  Abnormal posture  Muscle weakness (generalized)  ONSET DATE: chronic  SUBJECTIVE:                                                                                                                                                                                           SUBJECTIVE STATEMENT: Pt. Reports chronic h/o neck/shoulder/back pain secondary to mammary hypertrophy/ posture abnormalities.  Pt. Has attended PT in past with minimal benefits.  Pt. Scheduled to f/u with Plastic Surgeon to determine need for breast reduction to improve back pain/ posture.  Pt. Lives with 23 y/o son.  No previous injections or back surgeries reported. Pt. Enjoys walking around track in [place].  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  No falls.    PERTINENT HISTORY:  1. Macromastia: Referral to plastic surgery placed to discuss breast reduction. Ibuprofen refilled.   - Ambulatory referral to Plastic Surgery - ibuprofen (ADVIL) 600 MG tablet; Take 1 tablet (600 mg total) by mouth every 8 (eight) hours as needed for moderate pain (pain score 4-6).  Dispense: 30 tablet; Refill: 3   Return in about 4 months (around 11/12/2023) for CPE - whichever provider .   Rockney Cid, DO  Pt. Reports she is a 40H bra size and was a size D prior to son being born 12 yrs. Ago.    PAIN:  Are you having pain? Yes: NPRS scale: 5/10 pain Pain location: mid-back/neck/shoulders Pain description: constant Aggravating factors: sleeping position/ standing tasks Relieving factors: nothing  PRECAUTIONS: None  RED FLAGS: None   WEIGHT BEARING RESTRICTIONS: No  FALLS:  Has patient fallen in last 6 months? 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   NEXT MD VISIT: 11/15/23  OBJECTIVE:  Note: Objective measures were completed at Evaluation unless otherwise noted.  DIAGNOSTIC FINDINGS:  See MD note (thoracic/lumbar spine imaging on 04/12/2019)  PATIENT SURVEYS:  Modified Oswestry 20% self-perceived mild disability.   COGNITION: Overall cognitive status: Within functional limits for tasks assessed     SENSATION: WFL  MUSCLE LENGTH: Hamstrings: Right: WFL, Left: WFL Thomas test: NT  POSTURE: rounded shoulders and forward head  PALPATION: Marked B UT muscle tightness/ tenderness with deep palpation.    LUMBAR ROM:   AROM eval  Flexion WNL  Extension WNL  Right lateral flexion WNL  Left lateral flexion WNL  Right rotation WNL  Left rotation WNL   (Blank rows = not tested)  LOWER EXTREMITY ROM:     Cervical AROM WNL (all planes) B shoulder AROM WNL (all planes).  B shoulder flexion/ abduction 4+/5 MMT B LE AROM WFL (all planes).  B LE strength grossly 5/5 MMT except hip flexion 4+/5 MMT  LUMBAR SPECIAL TESTS:  Straight leg raise test: Negative, Slump test: Negative, and FABER test: Negative  FUNCTIONAL TESTS:  NT  GAIT: Distance walked: in clinic Assistive device utilized: None Level of assistance: Complete Independence Comments: Rounded sh./forward head posture with gait.  4+/5 sh. Flexion/ abduction  TREATMENT DATE: 11/17/23                                                                                                                            Subjective:  Pt. Reports persistent back pain with daily tasks (more upper than lower back symptoms).  Pt. Has no c/o back pain at  rest prior to tx. Session.   Pt. Reports compliance with HEP and purchased percussion/ massager for home use.  Pt. Scheduled to return to MD on 4/17.    There.ex.:  B UBE 2 min. F/b.    Supine 3# chest press/ shoulder horizontal abduction/ adduction/ alternating shoulder flexion 20x each.    Nautilus: 50#  lat. Pull downs/ 50# scap. Retraction 15x2.  Minimal cuing to correct upright posture/ technique.  Fatigue noted.    Reassessment of cervical/ thoracic/ B shoulder AROM/ posture.    Reviewed HEP  Manual tx.:   Supine LE/lumbar generalized stretches.  Supine B shoulder flexion/ horizontal abduction (pec stretches).    Seated/prone STM to upper/mid-back paraspinals (use of Hypervolt).  Prone grade II-III PA unilateral mobs (thoracic/lumbar spine)- 2x20 sec. (As tolerated).    Prone STM with use of Hypervolt (instructed for home use).     PATIENT EDUCATION:  Education details: Access Code: [email or web address] Person educated: Patient Education method: Medical illustrator Education comprehension: verbalized understanding and returned demonstration  HOME EXERCISE PROGRAM: Access Code: [email or web address] URL: [email or web address] Date: 10/25/2023 Prepared by: Hazeline Lister Exercises - Scapular Retraction with Resistance - 1 x daily - 4 x weekly - 2 sets - 15 reps - Shoulder W - External Rotation with Resistance - 1 x daily - 4 x weekly - 2 sets - 15 reps - Doorway Pec Stretch at 90 Degrees Abduction - 1 x daily - 7 x weekly - 1 sets - 5 reps - 20 seconds hold - Standing Lumbar Extension at Wall - Forearms - 1 x daily - 7 x weekly - 1 sets - 5 reps - 10 seconds hold   ASSESSMENT:  CLINICAL IMPRESSION: Pt. Demonstrates good technique with HEP and aware of importance of proper posture/ body mechanics.  Pt. Presents with moderate thoracic spine hypomobility with mobs./ STM.  Pt. Limited by upper back pain with increase activity/ repetitive tasks at home.  Pt. Encouraged to continue with HEP and avoid pain provoking movement patterns.  Pt. Will benefit from short-term skilled PT services to develop ex. Program to improve pain-free work-related/functional tasks.   OBJECTIVE IMPAIRMENTS: decreased activity tolerance, decreased endurance, decreased mobility, decreased ROM, decreased strength,  hypomobility, impaired flexibility, improper body mechanics, postural dysfunction, and pain.   ACTIVITY LIMITATIONS: carrying, lifting, and standing  PARTICIPATION LIMITATIONS: cleaning and occupation  PERSONAL FACTORS: Fitness and Past/current experiences are also affecting patient's functional outcome.   REHAB POTENTIAL: Good  CLINICAL DECISION MAKING: Stable/uncomplicated  EVALUATION COMPLEXITY: Low   GOALS: Goals reviewed with patient? Yes  SHORT TERM GOALS: Target date: 11/08/23  Pt. Independent with HEP to increase upright posture/ UE muscle strength to improve pain-free mobility.   Baseline:  HEP issued.   Goal status: Goal met  LONG TERM GOALS: Target date: 12/06/23  Pt. Will decrease MODI to <10% to improve back pain with daily tasks.   Baseline:  20% MODI Goal status: INITIAL  2.  Pt. Will reports 3/10 back/neck pain at worst with work-related/ home tasks.  Baseline:  >5/10 back pain at rest.   Goal status: INITIAL  3.  Pt. Able to walk on [place] track for 30 minutes with no increase c/o pain to improve functional mobility.   Baseline: pain limited.  Goal status: INITIAL  PLAN:  PT FREQUENCY: 1-2x/week  PT DURATION: 6 weeks  PLANNED INTERVENTIONS: 97110-Therapeutic exercises, 97530- Therapeutic activity, [id number]- Neuromuscular re-education, 97535- Self Care, 14782- Manual therapy, G0283- Electrical stimulation (unattended), [phone]- Electrical stimulation (manual), Patient/Family education, Taping,  Dry Needling, Joint mobilization, Joint manipulation, Spinal manipulation, Spinal mobilization, Cryotherapy, and Moist heat.  PLAN FOR NEXT SESSION: Send MD progress note next week  Lendell Quarry, PT, DPT # [phone] 11/20/2023, 7:58 PM

## 2023-11-21 ENCOUNTER — Ambulatory Visit: Admitting: Physical Therapy

## 2023-11-21 ENCOUNTER — Encounter: Payer: Self-pay | Admitting: Physical Therapy

## 2023-11-21 DIAGNOSIS — M546 Pain in thoracic spine: Secondary | ICD-10-CM

## 2023-11-21 DIAGNOSIS — M6281 Muscle weakness (generalized): Secondary | ICD-10-CM

## 2023-11-21 DIAGNOSIS — R293 Abnormal posture: Secondary | ICD-10-CM

## 2023-11-21 DIAGNOSIS — M542 Cervicalgia: Secondary | ICD-10-CM

## 2023-11-21 NOTE — Therapy (Signed)
 OUTPATIENT PHYSICAL THERAPY THORACOLUMBAR TREATMENT  Patient Name: Martha Grant MRN: [id number] DOB:April 15, 1981, 43 y.o., female Today's Date: 11/22/2023  END OF SESSION:  PT End of Session - 11/21/23 1654     Visit Number 6    Number of Visits 6    Date for PT Re-Evaluation 12/06/23    PT Start Time 1654    PT Stop Time 1725    PT Time Calculation (min) 31 min    Activity Tolerance Patient tolerated treatment well    Behavior During Therapy 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   Past Surgical History:  Procedure Laterality Date   CESAREAN SECTION     x 2   LAPAROSCOPIC BILATERAL SALPINGO OOPHERECTOMY Bilateral    ULNA OSTEOTOMY Right 12/21/2013   Procedure: OPEN ULNAR SHORTENING OSTEOTOMY RIGHT ;  Surgeon: Kemp Patter, MD;  Location: [facility];  Service: Orthopedics;  Laterality: Right;   WRIST ARTHROSCOPY WITH FOVEAL TRIANGULAR FIBROCARTILAGE COMPLEX REPAIR Right 12/21/2013   Procedure: ARTHROSCOPY DEBRIDEMENT TRIANGULAR CARTILAGE COMPLEX RIGHT WRIST;  Surgeon: Kemp Patter, MD;  Location: [facility];  Service: Orthopedics;  Laterality: Right;   Patient Active Problem List   Diagnosis Date Noted   Symptomatic mammary hypertrophy 10/04/2023   Back pain 10/04/2023   Neck pain 10/04/2023   Hypertension 10/12/2022   Class 3 severe obesity without serious comorbidity with body mass index (BMI) of 40.0 to 44.9 in adult [facility]) 09/14/2021   Headache disorder 05/14/2019    PCP: Rockney Cid, DO  REFERRING PROVIDER: Thornell Flirt, DO  REFERRING DIAG:  N62 (ICD-10-CM) - Symptomatic mammary hypertrophy  M54.6,G89.29 (ICD-10-CM) - Chronic bilateral thoracic back pain  M54.2 (ICD-10-CM) - Neck pain    Rationale for Evaluation and Treatment:  Rehabilitation  THERAPY DIAG:  Pain in thoracic spine  Neck pain  Abnormal posture  Muscle weakness (generalized)  ONSET DATE: chronic  SUBJECTIVE:                                                                                                                                                                                           SUBJECTIVE STATEMENT: Pt. Reports chronic h/o neck/shoulder/back pain secondary to mammary hypertrophy/ posture abnormalities.  Pt. Has attended PT in past with minimal benefits.  Pt. Scheduled to f/u with Plastic Surgeon to determine need for breast reduction to improve back pain/ posture.  Pt. Lives with 62 y/o son.  No previous injections or back surgeries reported. Pt. Enjoys walking around track in [place].  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  No falls.    PERTINENT HISTORY:  1. Macromastia: Referral to plastic surgery placed to discuss breast reduction. Ibuprofen refilled.   - Ambulatory referral to Plastic Surgery - ibuprofen (ADVIL) 600 MG tablet; Take 1 tablet (600 mg total) by mouth every 8 (eight) hours as needed for moderate pain (pain score 4-6).  Dispense: 30 tablet; Refill: 3   Return in about 4 months (around 11/12/2023) for CPE - whichever provider .   Rockney Cid, DO  Pt. Reports she is a 40H bra size and was a size D prior to son being born 12 yrs. Ago.    PAIN:  Are you having pain? Yes: NPRS scale: 5/10 pain Pain location: mid-back/neck/shoulders Pain description: constant Aggravating factors: sleeping position/ standing tasks Relieving factors: nothing  PRECAUTIONS: None  RED FLAGS: None   WEIGHT BEARING RESTRICTIONS: No  FALLS:  Has patient fallen in last 6 months? 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   NEXT MD VISIT: 11/15/23  OBJECTIVE:  Note: Objective measures were completed at Evaluation unless otherwise noted.  DIAGNOSTIC FINDINGS:  See MD note (thoracic/lumbar spine imaging on 04/12/2019)  PATIENT SURVEYS:  Modified Oswestry 20% self-perceived mild disability.   COGNITION: Overall cognitive status: Within functional limits for tasks assessed     SENSATION: WFL  MUSCLE LENGTH: Hamstrings: Right: WFL, Left: WFL Thomas test: NT  POSTURE: rounded shoulders and forward head  PALPATION: Marked B UT muscle tightness/ tenderness with deep palpation.    LUMBAR ROM:   AROM eval  Flexion WNL  Extension WNL  Right lateral flexion WNL  Left lateral flexion WNL  Right rotation WNL  Left rotation WNL   (Blank rows = not tested)  LOWER EXTREMITY ROM:     Cervical AROM WNL (all planes) B shoulder AROM WNL (all planes).  B shoulder flexion/ abduction 4+/5 MMT B LE AROM WFL (all planes).  B LE strength grossly 5/5 MMT except hip flexion 4+/5 MMT  LUMBAR SPECIAL TESTS:  Straight leg raise test: Negative, Slump test: Negative, and FABER test: Negative  FUNCTIONAL TESTS:  NT  GAIT: Distance walked: in clinic Assistive device utilized: None Level of assistance: Complete Independence Comments: Rounded sh./forward head posture with gait.  4+/5 sh. Flexion/ abduction  TREATMENT DATE: 11/17/23                                                                                                                            Subjective:  Pt. Has no c/o back pain at rest prior to tx. Session.  Pt. Is on spring break with son for the  week and is scheduled to return to MD on 4/17.  Pt. States she is having a lazy day.    There.ex.:  B UBE 2 min. F/b.    Standing B shoulder AROM reassessment all planes.    Nautilus: 50# lat. Pull downs/ 50# scap. Retraction/ 40# tricep extension/ 40# chest press 15x2.  Minimal cuing to correct upright posture/ technique.   Fatigue noted.    Supine 3# chest press/ shoulder horizontal abduction/ adduction/ alternating shoulder flexion 20x each.    Reassessment of cervical/ thoracic/ B shoulder AROM/ posture.    Reviewed HEP  Manual tx.:   Supine LE/lumbar generalized stretches.  Supine B shoulder flexion/ horizontal abduction (pec stretches).    Seated/prone STM to upper/mid-back paraspinals (use of Hypervolt).  Prone grade II-III PA unilateral mobs (thoracic/lumbar spine)- 2x20 sec. (As tolerated).    Prone STM with use of Hypervolt (instructed for home use).     PATIENT EDUCATION:  Education details: Access Code: [email or web address] Person educated: Patient Education method: Medical illustrator Education comprehension: verbalized understanding and returned demonstration  HOME EXERCISE PROGRAM: Access Code: [email or web address] URL: [email or web address] Date: 10/25/2023 Prepared by: Hazeline Lister Exercises - Scapular Retraction with Resistance - 1 x daily - 4 x weekly - 2 sets - 15 reps - Shoulder W - External Rotation with Resistance - 1 x daily - 4 x weekly - 2 sets - 15 reps - Doorway Pec Stretch at 90 Degrees Abduction - 1 x daily - 7 x weekly - 1 sets - 5 reps - 20 seconds hold - Standing Lumbar Extension at Wall - Forearms - 1 x daily - 7 x weekly - 1 sets - 5 reps - 10 seconds hold   ASSESSMENT:  CLINICAL IMPRESSION: Pt. Demonstrates good technique with HEP and aware of importance of proper posture/ body mechanics.  Pt. Presents with moderate thoracic spine hypomobility with mobs./ STM.  Pt. Limited by upper back pain with increase activity/ repetitive tasks at home.  Pt. Encouraged to continue with HEP and avoid pain provoking movement patterns.  Pt. Will benefit from short-term skilled PT services to develop ex. Program to improve pain-free work-related/functional tasks.   OBJECTIVE IMPAIRMENTS: decreased activity tolerance, decreased endurance, decreased mobility, decreased ROM, decreased  strength, hypomobility, impaired flexibility, improper body mechanics, postural dysfunction, and pain.   ACTIVITY LIMITATIONS: carrying, lifting, and standing  PARTICIPATION LIMITATIONS: cleaning and occupation  PERSONAL FACTORS: Fitness and Past/current experiences are also affecting patient's functional outcome.   REHAB POTENTIAL: Good  CLINICAL DECISION MAKING: Stable/uncomplicated  EVALUATION COMPLEXITY: Low   GOALS: Goals reviewed with patient? Yes  SHORT TERM GOALS: Target date: 11/08/23  Pt. Independent with HEP to increase upright posture/ UE muscle strength to improve pain-free mobility.   Baseline:  HEP issued.   Goal status: Goal met  LONG TERM GOALS: Target date: 12/06/23  Pt. Will decrease MODI to <10% to improve back pain with daily tasks.   Baseline:  20% MODI Goal status: INITIAL  2.  Pt. Will reports 3/10 back/neck pain at worst with work-related/ home tasks.  Baseline:  >5/10 back pain at rest.   Goal status: INITIAL  3.  Pt. Able to walk on [place] track for 30 minutes with no increase c/o pain to improve functional mobility.   Baseline: pain limited.  Goal status: INITIAL  PLAN:  PT FREQUENCY: 1-2x/week  PT DURATION: 6 weeks  PLANNED INTERVENTIONS: 97110-Therapeutic exercises, 97530- Therapeutic activity, [id number]- Neuromuscular re-education, 97535- Self Care, [id number]- Manual therapy, [id number]- Electrical stimulation (unattended),  [address]- Electrical stimulation (manual), Patient/Family education, Taping, Dry Needling, Joint mobilization, Joint manipulation, Spinal manipulation, Spinal mobilization, Cryotherapy, and Moist heat.  PLAN FOR NEXT SESSION: Send MD progress note next week  Lendell Quarry, PT, DPT # [phone] 11/22/2023, 7:27 AM

## 2023-11-24 ENCOUNTER — Encounter: Payer: Self-pay | Admitting: Physician Assistant

## 2023-11-24 ENCOUNTER — Ambulatory Visit: Admitting: Physical Therapy

## 2023-11-24 ENCOUNTER — Ambulatory Visit (INDEPENDENT_AMBULATORY_CARE_PROVIDER_SITE_OTHER): Admitting: Physician Assistant

## 2023-11-24 ENCOUNTER — Encounter: Payer: Self-pay | Admitting: Physical Therapy

## 2023-11-24 ENCOUNTER — Encounter: Payer: BC Managed Care – PPO | Admitting: Internal Medicine

## 2023-11-24 VITALS — BP 157/107 | HR 74 | Ht 64.0 in | Wt 242.6 lb

## 2023-11-24 DIAGNOSIS — N62 Hypertrophy of breast: Secondary | ICD-10-CM | POA: Diagnosis not present

## 2023-11-24 NOTE — Progress Notes (Addendum)
   Referring Provider Rockney Cid, DO [address] [place],  [place] [address]   CC:  Chief Complaint  Patient presents with   Follow-up      Martha Grant is an 43 y.o. female.  HPI: Patient is a 43 y.o. year old female here for follow up after completing physical therapy for pain related to macromastia.  She was seen for initial consult by Dr. Orin Birk 10/04/2023.  At that time, complained of chronic upper back and neck discomfort in the context of large breasts.  BMI equals 41.4 kg/m.  Preoperative bra size equaled 40H cup.  Estimated tissue removed at time of surgery equals 820 g each side.  STN 36 cm on the right, 37 cm on the left.  Diagnostic mammogram from 2019 was BI-RADS Category 1: Negative.  Screening mammogram ordered due to age.  Plan was for follow-up after PT.  Today, she reports that she has completed all of her PT sessions.  She continues to endorse daily upper back, lower back, and neck discomfort which she attributes to her particularly large breast size.  She also endorses frequent inframammary rashes, worse in the summer, that are refractory to medical management.  Certain exercises such as racing her 39 year old son are hindered by her memory hypertrophy.  She also endorses shoulder grooves and difficulty finding clothes that fit appropriately.  Patient tells me that she is still hopeful for breast reduction surgery given her symptomatic macromastia.  She understands that, given her age, screening mammogram will need to be obtained prior to surgery.  Weight has been stable since consult.   Review of Systems General: Denies fevers MSK: Endorses ongoing back and neck discomfort Skin: Endorses intermittent inframammary rashes  Physical Exam    11/24/2023    9:26 AM 10/04/2023    9:00 AM 10/04/2023    8:38 AM  Vitals with BMI  Height 5\' 4"   5\' 4"   Weight 242 lbs 10 oz  241 lbs 10 oz  BMI 41.62  41.45  Systolic 157 175 604  Diastolic 107  109 113  Pulse 74 68 74    General:  No acute distress,  Alert and oriented, Non-Toxic, Normal speech and affect Psych: Normal behavior and mood Respiratory: No increased WOB MSK: Ambulatory  Assessment/Plan  Symptomatic mammary hypertrophy:  Patient is interested in pursuing surgical intervention for bilateral breast reduction. Patient has completed at least 6 weeks of physical therapy for pain related to macromastia.  Discussed with patient we would submit to insurance for authorization, discussed approval could take up to 6 weeks.  Will place order for screening mammogram and she will call the office if she does not hear from the imaging center by the end of next week.  [name] 11/24/2023, 9:48 AM

## 2023-11-28 ENCOUNTER — Ambulatory Visit: Admitting: Physical Therapy

## 2023-12-01 ENCOUNTER — Encounter: Admitting: Physical Therapy

## 2023-12-02 ENCOUNTER — Encounter: Payer: Self-pay | Admitting: Internal Medicine

## 2023-12-02 ENCOUNTER — Encounter: Payer: Self-pay | Admitting: Plastic Surgery

## 2023-12-02 NOTE — Telephone Encounter (Signed)
 Good Morning [name],  Would you mind helping with this?   Thank you, Martha Grant

## 2023-12-06 ENCOUNTER — Encounter: Admitting: Physical Therapy

## 2023-12-08 ENCOUNTER — Encounter: Admitting: Physical Therapy

## 2023-12-09 ENCOUNTER — Ambulatory Visit: Admitting: Student

## 2023-12-16 ENCOUNTER — Other Ambulatory Visit: Payer: Self-pay

## 2023-12-16 ENCOUNTER — Ambulatory Visit (INDEPENDENT_AMBULATORY_CARE_PROVIDER_SITE_OTHER): Admitting: Internal Medicine

## 2023-12-16 ENCOUNTER — Encounter: Payer: Self-pay | Admitting: Internal Medicine

## 2023-12-16 ENCOUNTER — Other Ambulatory Visit (HOSPITAL_COMMUNITY)
Admission: RE | Admit: 2023-12-16 | Discharge: 2023-12-16 | Disposition: A | Discharge status: Home / Self Care | Source: Ambulatory Visit | Attending: Internal Medicine | Admitting: Internal Medicine

## 2023-12-16 ENCOUNTER — Ambulatory Visit
Admission: RE | Admit: 2023-12-16 | Discharge: 2023-12-16 | Disposition: A | Discharge status: Home / Self Care | Source: Ambulatory Visit | Attending: Physician Assistant

## 2023-12-16 VITALS — BP 120/80 | HR 100 | Temp 97.9°F | Resp 16 | Ht 64.0 in | Wt 242.0 lb

## 2023-12-16 DIAGNOSIS — R7303 Prediabetes: Secondary | ICD-10-CM

## 2023-12-16 DIAGNOSIS — Z0000 Encounter for general adult medical examination without abnormal findings: Secondary | ICD-10-CM | POA: Diagnosis not present

## 2023-12-16 DIAGNOSIS — E559 Vitamin D deficiency, unspecified: Secondary | ICD-10-CM

## 2023-12-16 DIAGNOSIS — Z113 Encounter for screening for infections with a predominantly sexual mode of transmission: Secondary | ICD-10-CM | POA: Diagnosis present

## 2023-12-16 DIAGNOSIS — Z13220 Encounter for screening for lipoid disorders: Secondary | ICD-10-CM | POA: Diagnosis not present

## 2023-12-16 DIAGNOSIS — E041 Nontoxic single thyroid nodule: Secondary | ICD-10-CM

## 2023-12-16 DIAGNOSIS — I10 Essential (primary) hypertension: Secondary | ICD-10-CM | POA: Diagnosis not present

## 2023-12-16 DIAGNOSIS — R7989 Other specified abnormal findings of blood chemistry: Secondary | ICD-10-CM

## 2023-12-16 MED ORDER — LISINOPRIL 10 MG PO TABS: 10.0000 mg | ORAL_TABLET | Freq: Every day | ORAL | 3 refills | Status: AC

## 2023-12-16 NOTE — Progress Notes (Signed)
 Name: DELBERTA PILLION   MRN: [id number]    DOB: November 23, 1980   Date:12/16/2023       Progress Note  Subjective  Chief Complaint  Chief Complaint  Patient presents with   Annual Exam    HPI  Patient presents for annual CPE.  Discussed the use of AI scribe software for clinical note transcription with the patient, who gave verbal consent to proceed.  History of Present Illness Leasha Karow Hounshell is a 43 year old female who presents for an annual physical exam.  She has a distinct, non-painful mass in her neck for two to three weeks with no associated symptoms and no thyroid issues.  Her medications include ibuprofen  as needed, lisinopril 10 mg for hypertension, and propranolol and nortriptyline for migraines as needed. She has not filled her lisinopril prescription in the past year and needs a refill.  Her menstrual cycles are regular, and she is not on birth control due to a previous tubal ligation.  She has not had an eye exam in four to five years and does not wear corrective lenses. She recently had a mammogram and a dental exam, with results pending for the latter. She plans to undergo breast reduction surgery next month.  Her diet is balanced, though she avoids certain food groups. She exercises by walking the track about five days a week for thirty minutes each session.    Diet: Regular - tries to eat well rounded  Exercise: 5 day 30 minutes walking track  Last Eye Exam: will schedule Last Dental Exam: completed  Flowsheet Row [facility] Visit from [facility]/16/20[facility] in [facility]  AUDIT-C Score 0      Depression: Phq 9 is  negative    12/16/2023    2:18 PM 07/14/2023    8:38 AM 03/16/2023    1:42 PM 01/14/2023    8:35 AM 01/11/2023    3:40 PM  Depression screen PHQ 2/9  Decreased Interest 0 0 0 0 0  Down, Depressed, Hopeless 0 0 0 0 0  PHQ - 2 Score 0 0 0 0 0  Altered sleeping   0 0 0  Tired, decreased energy   0 0 0  Change in appetite   0 0 0  Feeling bad or  failure about yourself    0 0 0  Trouble concentrating   0 0 0  Moving slowly or fidgety/restless   0 0 0  Suicidal thoughts   0 0 0  PHQ-9 Score   0 0 0  Difficult doing work/chores   Not difficult at all  Not difficult at all   Hypertension: BP Readings from Last 3 Encounters:  12/16/23 120/80  11/24/23 (!) 157/107  10/04/23 (!) 175/109   Obesity: Wt Readings from Last 3 Encounters:  12/16/23 242 lb (109.8 kg)  11/24/23 242 lb 9.6 oz (110 kg)  10/04/23 241 lb 9.6 oz (109.6 kg)   BMI Readings from Last 3 Encounters:  12/16/23 41.54 kg/m  11/24/23 41.64 kg/m  10/04/23 41.47 kg/m     Vaccines: UTD reviewed with the patient.   Hep C Screening: completed STD testing and prevention (HIV/chl/gon/syphilis): screening due Intimate partner violence: negative screen  LMP: regular, history of tubal ligation  Discussed importance of follow up if any post-menopausal bleeding: not applicable  Incontinence Symptoms: negative for symptoms   Breast cancer:  - Last Mammogram: scheduled today   Osteoporosis Prevention : Discussed high calcium and vitamin D  supplementation, weight bearing exercises Bone  density :not applicable   Cervical cancer screening: up-to-date Pap 3/24 negative   Skin cancer: Discussed monitoring for atypical lesions  Colorectal cancer: NA - discussed starting screening at age 49 Lung cancer:  Low Dose CT Chest recommended if Age 38-80 years, 20 pack-year currently smoking OR have quit w/in 15years. Patient does not qualify for screen     Advanced Care Planning: A voluntary discussion about advance care planning including the explanation and discussion of advance directives.  Discussed health care proxy and Living will, and the patient was able to identify a health care proxy as Anice Kerbs (mother).  Patient does not have a living will and power of attorney of health care   Patient Active Problem List   Diagnosis Date Noted   Symptomatic mammary  hypertrophy 10/04/2023   Back pain 10/04/2023   Neck pain 10/04/2023   Hypertension 10/12/2022   Class 3 severe obesity without serious comorbidity with body mass index (BMI) of 40.0 to 44.9 in adult 09/14/2021   Headache disorder 05/14/2019    Past Surgical History:  Procedure Laterality Date   CESAREAN SECTION     x 2   LAPAROSCOPIC BILATERAL SALPINGO OOPHERECTOMY Bilateral    ULNA OSTEOTOMY Right 12/21/2013   Procedure: OPEN ULNAR SHORTENING OSTEOTOMY RIGHT ;  Surgeon: Kemp Patter, MD;  Location: [facility];  Service: Orthopedics;  Laterality: Right;   WRIST ARTHROSCOPY WITH FOVEAL TRIANGULAR FIBROCARTILAGE COMPLEX REPAIR Right 12/21/2013   Procedure: ARTHROSCOPY DEBRIDEMENT TRIANGULAR CARTILAGE COMPLEX RIGHT WRIST;  Surgeon: Kemp Patter, MD;  Location: [facility];  Service: Orthopedics;  Laterality: Right;    Family History  Problem Relation Age of Onset   Healthy Mother    Hypertension Father    Healthy Brother    Asthma Son    Breast cancer Maternal Grandmother    Hypertension Maternal Grandmother     Social History   Socioeconomic History   Marital status: Significant Other    Spouse name: Not on file   Number of children: 1   Years of education: 12   Highest education level: Associate degree: academic program  Occupational History   Not on file  Tobacco Use   Smoking status: Never    Passive exposure: Never   Smokeless tobacco: Never  Vaping Use   Vaping status: Never Used  Substance and Sexual Activity   Alcohol use: No   Drug use: No   Sexual activity: Yes    Birth control/protection: None    Comment: hx depo and ocp, stopped ocp "years ago"  Other Topics Concern   Not on file  Social History Narrative   Not on file   Social Drivers of Health   Financial Resource Strain: Low Risk  (12/16/2023)   Overall Financial Resource Strain (CARDIA)    Difficulty of Paying Living Expenses: Not hard at all  Food Insecurity: No  Food Insecurity (12/16/2023)   Hunger Vital Sign    Worried About Running Out of Food in the Last Year: Never true    Ran Out of Food in the Last Year: Never true  Transportation Needs: No Transportation Needs (12/16/2023)   PRAPARE - Administrator, Civil Service (Medical): No    Lack of Transportation (Non-Medical): No  Physical Activity: Sufficiently Active (12/16/2023)   Exercise Vital Sign    Days of Exercise per Week: 5 days    Minutes of Exercise per Session: 30 min  Stress: No Stress Concern Present (12/16/2023)  [employer] of Occupational Health - Occupational Stress Questionnaire    Feeling of Stress : Not at all  Social Connections: Unknown (12/16/2023)   Social Connection and Isolation Panel [NHANES]    Frequency of Communication with Friends and Family: More than three times a week    Frequency of Social Gatherings with Friends and Family: More than three times a week    Attends Religious Services: Not on file    Active Member of Clubs or Organizations: Patient declined    Attends Banker Meetings: Patient declined    Marital Status: Never married  Intimate Partner Violence: Not At Risk (12/16/2023)   Humiliation, Afraid, Rape, and Kick questionnaire    Fear of Current or Ex-Partner: No    Emotionally Abused: No    Physically Abused: No    Sexually Abused: No     Current Outpatient Medications:    ibuprofen  (ADVIL ) 600 MG tablet, Take 1 tablet (600 mg total) by mouth every 8 (eight) hours as needed for moderate pain (pain score 4-6)., Disp: 30 tablet, Rfl: 3   lisinopril (ZESTRIL) 10 MG tablet, Take 10 mg by mouth daily., Disp: , Rfl:    nortriptyline (PAMELOR) 50 MG capsule, Take 50 mg by mouth at bedtime., Disp: , Rfl:    propranolol (INDERAL) 20 MG tablet, Take 20 mg by mouth in the morning and at bedtime., Disp: , Rfl:   No Known Allergies   Review of Systems  All other systems reviewed and are negative.   Objective  Vitals:    12/16/23 1418  BP: 120/80  Pulse: 100  Resp: 16  Temp: 97.9 F (36.6 C)  TempSrc: Oral  SpO2: 98%  Weight: 242 lb (109.8 kg)  Height: 5\' 4"  (1.626 m)    Body mass index is 41.54 kg/m.  Physical Exam Constitutional:      Appearance: Normal appearance.  HENT:     Head: Normocephalic and atraumatic.     Mouth/Throat:     Mouth: Mucous membranes are moist.     Pharynx: Oropharynx is clear.  Eyes:     Extraocular Movements: Extraocular movements intact.     Conjunctiva/sclera: Conjunctivae normal.     Pupils: Pupils are equal, round, and reactive to light.  Neck:     Comments: Soft but firm mass in the anterior midline of neck with distinct borders Cardiovascular:     Rate and Rhythm: Normal rate and regular rhythm.  Pulmonary:     Effort: Pulmonary effort is normal.     Breath sounds: Normal breath sounds.  Musculoskeletal:     Cervical back: No tenderness.  Lymphadenopathy:     Cervical: No cervical adenopathy.  Skin:    General: Skin is warm and dry.  Neurological:     General: No focal deficit present.     Mental Status: She is alert. Mental status is at baseline.  Psychiatric:        Mood and Affect: Mood normal.        Behavior: Behavior normal.     Last CBC Lab Results  Component Value Date   WBC 6.2 10/12/2022   HGB 12.1 10/12/2022   HCT 35.7 10/12/2022   MCV 94.9 10/12/2022   MCH 32.2 10/12/2022   RDW 12.4 10/12/2022   PLT 296 10/12/2022   Last metabolic panel Lab Results  Component Value Date   GLUCOSE 84 10/12/2022   NA 140 10/12/2022   K 4.1 10/12/2022   CL 105 10/12/2022   CO2  26 10/12/2022   BUN 9 10/12/2022   CREATININE 0.72 10/12/2022   EGFR 109 10/12/2022   CALCIUM 8.5 (L) 10/12/2022   PROT 7.0 10/12/2022   ALBUMIN 4.1 10/11/2020   BILITOT 0.3 10/12/2022   ALKPHOS 92 10/11/2020   AST 16 10/12/2022   ALT 15 10/12/2022   ANIONGAP 9 10/11/2020   Last lipids Lab Results  Component Value Date   CHOL 153 10/12/2022   HDL 42 (L)  10/12/2022   LDLCALC 98 10/12/2022   TRIG 44 10/12/2022   CHOLHDL 3.6 10/12/2022   Last hemoglobin A1c Lab Results  Component Value Date   HGBA1C 5.6 10/12/2022   Last thyroid functions Lab Results  Component Value Date   TSH 1.12 09/14/2021   Last vitamin D  Lab Results  Component Value Date   VD25OH 22 (L) 10/12/2022   Last vitamin B12 and Folate No results found for: "VITAMINB12", "FOLATE"    Assessment & Plan  1. Annual physical exam (Primary)/Prediabetes/Lipid screening/Vitamin D  deficiency/Screening examination for STD (sexually transmitted disease): Physical exam completed, health maintenance reviewed and annual labs ordered.   - CBC w/Diff/Platelet - COMPLETE METABOLIC PANEL WITHOUT GFR - Lipid Profile - HIV antibody (with reflex) - RPR - TSH - HgB A1c - Vitamin D  (25 hydroxy)  2. Primary hypertension: Blood pressure stable here today, no changes made to medications and appropriate refills sent to pharmacy. Labs due.   - CBC w/Diff/Platelet - COMPLETE METABOLIC PANEL WITHOUT GFR - lisinopril (ZESTRIL) 10 MG tablet; Take 1 tablet (10 mg total) by mouth daily.  Dispense: 90 tablet; Refill: 3  3. Thyroid nodule: Will check thyroid function with labs and order US .   - TSH - US  THYROID; Future   -USPSTF grade A and B recommendations reviewed with patient; age-appropriate recommendations, preventive care, screening tests, etc discussed and encouraged; healthy living encouraged; see AVS for patient education given to patient -Discussed importance of 150 minutes of physical activity weekly, eat two servings of fish weekly, eat one serving of tree nuts ( cashews, pistachios, pecans, almonds.Aaron Aas) every other day, eat 6 servings of fruit/vegetables daily and drink plenty of water and avoid sweet beverages.   -Reviewed Health Maintenance: Yes.

## 2023-12-17 LAB — TSH: TSH: <0.01 m[IU]/L — ABNORMAL LOW

## 2023-12-19 NOTE — Addendum Note (Signed)
 Addended by: Rockney Cid on: 12/19/2023 07:58 AM   Modules accepted: Orders

## 2023-12-20 ENCOUNTER — Encounter: Payer: Self-pay | Admitting: Internal Medicine

## 2023-12-20 ENCOUNTER — Ambulatory Visit
Admission: RE | Admit: 2023-12-20 | Discharge: 2023-12-20 | Disposition: A | Discharge status: Home / Self Care | Source: Ambulatory Visit | Attending: Internal Medicine | Admitting: Internal Medicine

## 2023-12-20 DIAGNOSIS — E041 Nontoxic single thyroid nodule: Secondary | ICD-10-CM | POA: Insufficient documentation

## 2023-12-21 ENCOUNTER — Ambulatory Visit: Payer: Self-pay | Admitting: Internal Medicine

## 2023-12-21 DIAGNOSIS — E559 Vitamin D deficiency, unspecified: Secondary | ICD-10-CM

## 2023-12-21 LAB — LIPID PANEL
Cholesterol: 166 mg/dL (ref <200)
HDL: 43 mg/dL — ABNORMAL LOW (ref >=50)
LDL Cholesterol (Calc): 107 mg/dL — ABNORMAL HIGH
Non-HDL Cholesterol (Calc): 123 mg/dL (ref <130)
Total CHOL/HDL Ratio: 3.9 (calc) (ref <5.0)
Triglycerides: 74 mg/dL (ref <150)

## 2023-12-21 LAB — COMPLETE METABOLIC PANEL WITHOUT GFR
AG Ratio: 1.3 (calc) (ref 1.0–2.5)
ALT: 18 U/L (ref 6–29)
AST: 15 U/L (ref 10–30)
Albumin: 4 g/dL (ref 3.6–5.1)
Alkaline phosphatase (APISO): 96 U/L (ref 31–125)
BUN: 9 mg/dL (ref 7–25)
CO2: 28 mmol/L (ref 20–32)
Calcium: 9 mg/dL (ref 8.6–10.2)
Chloride: 104 mmol/L (ref 98–110)
Creat: 0.71 mg/dL (ref 0.50–0.99)
Globulin: 3.2 g/dL (ref 1.9–3.7)
Glucose, Bld: 155 mg/dL — ABNORMAL HIGH (ref 65–99)
Potassium: 3.8 mmol/L (ref 3.5–5.3)
Sodium: 138 mmol/L (ref 135–146)
Total Bilirubin: 0.3 mg/dL (ref 0.2–1.2)
Total Protein: 7.2 g/dL (ref 6.1–8.1)

## 2023-12-21 LAB — CBC WITH DIFFERENTIAL/PLATELET
Absolute Lymphocytes: 2087 {cells}/uL (ref 850–3900)
Absolute Monocytes: 497 {cells}/uL (ref 200–950)
Basophils Absolute: 28 {cells}/uL (ref 0–200)
Basophils Relative: 0.4 %
Eosinophils Absolute: 99 {cells}/uL (ref 15–500)
Eosinophils Relative: 1.4 %
HCT: 34.8 % — ABNORMAL LOW (ref 35.0–45.0)
Hemoglobin: 11.9 g/dL (ref 11.7–15.5)
MCH: 32 pg (ref 27.0–33.0)
MCHC: 34.2 g/dL (ref 32.0–36.0)
MCV: 93.5 fL (ref 80.0–100.0)
MPV: 10.4 fL (ref 7.5–12.5)
Monocytes Relative: 7 %
Neutro Abs: 4388 {cells}/uL (ref 1500–7800)
Neutrophils Relative %: 61.8 %
Platelets: 284 10*3/uL (ref 140–400)
RBC: 3.72 10*6/uL — ABNORMAL LOW (ref 3.80–5.10)
RDW: 12.2 % (ref 11.0–15.0)
Total Lymphocyte: 29.4 %
WBC: 7.1 10*3/uL (ref 3.8–10.8)

## 2023-12-21 LAB — THYROID PANEL WITH TSH
Free Thyroxine Index: 2.9 (ref 1.4–3.8)
T3 Uptake: 32 % (ref 22–35)
T4, Total: 9 ug/dL (ref 5.1–11.9)
TSH: <0.01 m[IU]/L — ABNORMAL LOW

## 2023-12-21 LAB — HEMOGLOBIN A1C
Hgb A1c MFr Bld: 6.1 % — ABNORMAL HIGH (ref <5.7)
Mean Plasma Glucose: 128 mg/dL
eAG (mmol/L): 7.1 mmol/L

## 2023-12-21 LAB — CERVICOVAGINAL ANCILLARY ONLY
Chlamydia: NEGATIVE
Comment: NEGATIVE
Comment: NEGATIVE
Comment: NORMAL
Neisseria Gonorrhea: NEGATIVE
Trichomonas: NEGATIVE

## 2023-12-21 LAB — HIV ANTIBODY (ROUTINE TESTING W REFLEX): HIV 1&2 Ab, 4th Generation: NONREACTIVE

## 2023-12-21 LAB — RPR: RPR Ser Ql: NONREACTIVE

## 2023-12-21 LAB — VITAMIN D 25 HYDROXY (VIT D DEFICIENCY, FRACTURES): Vit D, 25-Hydroxy: 17 ng/mL — ABNORMAL LOW (ref 30–100)

## 2023-12-21 MED ORDER — VITAMIN D (ERGOCALCIFEROL) 1.25 MG (50000 UNIT) PO CAPS: 50000.0000 [IU] | ORAL_CAPSULE | ORAL | 0 refills | Status: AC

## 2023-12-26 ENCOUNTER — Encounter: Payer: Self-pay | Admitting: Internal Medicine

## 2023-12-27 ENCOUNTER — Other Ambulatory Visit: Payer: Self-pay | Admitting: Nurse Practitioner

## 2023-12-27 DIAGNOSIS — E041 Nontoxic single thyroid nodule: Secondary | ICD-10-CM

## 2024-01-05 ENCOUNTER — Ambulatory Visit (INDEPENDENT_AMBULATORY_CARE_PROVIDER_SITE_OTHER): Admitting: Student

## 2024-01-05 VITALS — BP 155/106 | HR 79 | Ht 64.0 in | Wt 242.0 lb

## 2024-01-05 DIAGNOSIS — Z719 Counseling, unspecified: Secondary | ICD-10-CM

## 2024-01-05 DIAGNOSIS — N62 Hypertrophy of breast: Secondary | ICD-10-CM

## 2024-01-05 MED ORDER — CEPHALEXIN 500 MG PO CAPS: 500.0000 mg | ORAL_CAPSULE | Freq: Four times a day (QID) | ORAL | 0 refills | Status: AC

## 2024-01-05 MED ORDER — OXYCODONE HCL 5 MG PO TABS: 5.0000 mg | ORAL_TABLET | Freq: Four times a day (QID) | ORAL | 0 refills | Status: AC | PRN

## 2024-01-05 MED ORDER — ONDANSETRON HCL 4 MG PO TABS: 4.0000 mg | ORAL_TABLET | Freq: Three times a day (TID) | ORAL | 0 refills | Status: AC | PRN

## 2024-01-05 NOTE — Progress Notes (Signed)
 Patient ID: Martha Grant, female    DOB: [birth year], 43 y.o.   MRN: [id number]  Chief Complaint  Patient presents with   pre op      ICD-10-CM   1. Symptomatic mammary hypertrophy  N62        History of Present Illness: Martha Grant is a 43 y.o.  female  with a history of macromastia.  She presents for preoperative evaluation for upcoming procedure, Bilateral Breast Reduction with liposuction, scheduled for 01/18/24 with Dr.  Orin Birk  The patient has not had problems with anesthesia.  Patient denies any personal or family history of breast cancer.  She denies any history of cardiac disease.  She reports she is not a smoker.  Patient denies taking any birth control or hormone replacement.  She denies any history of miscarriages.  She denies any personal family history of blood clots or clotting diseases.  She denies any recent surgeries, traumas or infections.  She denies any history of stroke or heart attack.  She denies any history of Crohn's disease or ulcerative colitis.  She denies any history of asthma or cancer.  She denies any varicosities to her lower extremities.  She denies any recent fevers, chills or changes in her health.  Patient reports she is currently a 40H cup.  She states she would like to be a C cup.  Discussed with patient that cup size cannot be guaranteed.  Patient expressed understanding.  Summary of Previous Visit: Patient was seen for initial consult for Dr. Orin Birk in 10/04/2023.  At this visit, patient complained of upper back and neck pain due to her enlarged breasts.  On exam, her resting on the right was 36 cm and her STN on the left was 37 cm.  Her preoperative bra size was an H cup.  The estimated amount of excess breast tissue to be removed at the time of surgery was 1000 g bilaterally.  Estimated excess breast tissue to be removed at time of surgery: 1000 grams  Job: Works as a Advertising account executive, planning to take 2 weeks off  PMH  Significant for: Hypertension, headache disorder, macromastia  Patient's blood pressure is elevated in clinic today.  She denies any new headaches, changes in her vision, chest pain or shortness of breath.  She states that her blood pressure tends to be elevated when she is at the doctor's office.  She does state that she has a blood pressure cuff at home.  I recommended that patient take her blood pressure that when she goes home and if it remains elevated she should contact her primary care provider.  She expressed understanding.   Past Medical History: Allergies: No Known Allergies  Current Medications:  Current Outpatient Medications:    ibuprofen  (ADVIL ) 600 MG tablet, Take 1 tablet (600 mg total) by mouth every 8 (eight) hours as needed for moderate pain (pain score 4-6)., Disp: 30 tablet, Rfl: 3   lisinopril  (ZESTRIL ) 10 MG tablet, Take 1 tablet (10 mg total) by mouth daily., Disp: 90 tablet, Rfl: 3   nortriptyline (PAMELOR) 50 MG capsule, Take 50 mg by mouth at bedtime., Disp: , Rfl:    propranolol (INDERAL) 20 MG tablet, Take 20 mg by mouth in the morning and at bedtime., Disp: , Rfl:    Vitamin D , Ergocalciferol , (DRISDOL ) 1.25 MG (50000 UNIT) CAPS capsule, Take 1 capsule (50,000 Units total) by mouth every 7 (seven) days., Disp: 12 capsule, Rfl: 0  Past Medical Problems:  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;  Service: Orthopedics;  Laterality: Right;    Social History: Social History   Socioeconomic History   Marital status: Significant Other    Spouse name: Not on file   Number of children: 1   Years of education: 12   Highest education level: Associate degree: academic program  Occupational History   Not on file  Tobacco Use   Smoking status: Never    Passive exposure: Never   Smokeless tobacco: Never  Vaping Use   Vaping status: Never Used  Substance and Sexual Activity   Alcohol use: No   Drug use: No   Sexual activity: Yes    Birth control/protection: None    Comment: hx depo and ocp, stopped ocp "years ago"  Other Topics Concern   Not on file  Social History Narrative   Not on file   Social Drivers of Health   Financial Resource Strain: Low Risk  (12/16/2023)   Overall Financial Resource Strain (CARDIA)    Difficulty of Paying Living Expenses: Not hard at all  Food Insecurity: No Food Insecurity (12/16/2023)   Hunger Vital Sign    Worried About Running Out of Food in the Last Year: Never true    Ran Out of Food in the Last Year: Never true  Transportation Needs: No Transportation Needs (12/16/2023)   PRAPARE - Administrator, Civil Service (Medical): No    Lack of Transportation (Non-Medical): No  Physical Activity: Sufficiently Active (12/16/2023)   Exercise Vital Sign    Days of Exercise per Week: 5 days    Minutes of Exercise per Session: 30 min  Stress: No Stress Concern Present (12/16/2023)   [employer] of Occupational Health - Occupational Stress Questionnaire    Feeling of Stress : Not at all  Social Connections: Unknown (12/16/2023)   Social Connection and Isolation Panel [NHANES]    Frequency of Communication with Friends and Family: More than three times a week    Frequency of Social Gatherings with  Friends and Family: More than three times a week    Attends Religious Services: Not on file    Active Member of Clubs or Organizations: Patient declined    Attends Banker Meetings: Patient declined    Marital Status: Never married  Intimate Partner Violence: Not At Risk (12/16/2023)   Humiliation, Afraid, Rape, and Kick questionnaire    Fear of Current or Ex-Partner: No    Emotionally Abused: No    Physically Abused: No    Sexually Abused: No    Family History: Family History  Problem Relation Age of Onset   Healthy Mother    Hypertension Father    Healthy Brother    Asthma Son    Breast cancer  Maternal Grandmother    Hypertension Maternal Grandmother     Review of Systems: Denies any recent fevers, chills or changes in her health  Physical Exam: Vital Signs BP (!) 155/106 (BP Location: Left Arm, Patient Position: Sitting, Cuff Size: Large)   Pulse 79   Ht 5\' 4"  (1.626 m)   Wt 242 lb (109.8 kg)   LMP 11/29/2023   SpO2 97%   BMI 41.54 kg/m   Physical Exam  Constitutional:      General: Not in acute distress.    Appearance: Normal appearance. Not ill-appearing.  HENT:     Head: Normocephalic and atraumatic.  Neck:     Musculoskeletal: Normal range of motion.  Cardiovascular:     Rate and Rhythm: Normal rate Pulmonary:     Effort: Pulmonary effort is normal. No respiratory distress.  Musculoskeletal: Normal range of motion.  Skin:    General: Skin is warm and dry.     Findings: No erythema or rash.  Neurological:     Mental Status: Alert and oriented to person, place, and time. Mental status is at baseline.  Psychiatric:        Mood and Affect: Mood normal.        Behavior: Behavior normal.    Assessment/Plan: The patient is scheduled for bilateral breast reduction with Dr. Orin Birk.  Risks, benefits, and alternatives of procedure discussed, questions answered and consent obtained.    Smoking Status: Nonsmoker ; Counseling Given? NA Last  Mammogram: 12/16/2023; Results: BI-RADS Category 1 negative  Caprini Score: 4; Risk Factors include: BMI > 40 and length of planned surgery. Recommendation for mechanical  prophylaxis. Encourage early ambulation.   Pictures obtained: @consult   Post-op Rx sent to pharmacy: Oxycodone , Zofran , Keflex   Instructed patient to hold her ibuprofen  1 week prior to surgery.  Instructed her to hold her lisinopril  and nortriptyline the day of surgery.  Patient expressed understanding.  Patient was provided with the breast reduction and General Surgical Risk consent document and Pain Medication Agreement prior to their appointment.  They had adequate time to read through the risk consent documents and Pain Medication Agreement. We also discussed them in person together during this preop appointment. All of their questions were answered to their satisfaction.  Recommended calling if they have any further questions.  Risk consent form and Pain Medication Agreement to be scanned into patient's chart.  The risk that can be encountered with breast reduction were discussed and include the following but not limited to these:  Breast asymmetry, fluid accumulation, firmness of the breast, inability to breast feed, loss of nipple or areola, skin loss, decrease or no nipple sensation, fat necrosis of the breast tissue, bleeding, infection, healing delay.  There are risks of anesthesia, changes to skin sensation and injury to nerves or blood vessels.  The muscle can be temporarily or permanently injured.  You may have an allergic reaction to tape, suture, glue, blood products which can result in skin discoloration, swelling, pain, skin lesions, poor healing.  Any of these can lead to the need for revisonal surgery or stage procedures.  A reduction has potential to interfere with diagnostic procedures.  Nipple or breast piercing can increase risks of infection.  This procedure is best done when the breast is fully developed.   Changes in the breast will continue to occur over time.  Pregnancy can alter the outcomes of previous breast reduction surgery, weight gain and weigh loss can also effect the long term appearance.  Electronically signed by: Harden Leyden, PA-C 01/05/2024 10:32 AM

## 2024-01-05 NOTE — H&P (View-Only) (Signed)
 Patient ID: Martha Grant, female    DOB: [birth year], 43 y.o.   MRN: [id number]  Chief Complaint  Patient presents with   pre op      ICD-10-CM   1. Symptomatic mammary hypertrophy  N62        History of Present Illness: Martha Grant is a 43 y.o.  female  with a history of macromastia.  She presents for preoperative evaluation for upcoming procedure, Bilateral Breast Reduction with liposuction, scheduled for 01/18/24 with Dr.  Orin Birk  The patient has not had problems with anesthesia.  Patient denies any personal or family history of breast cancer.  She denies any history of cardiac disease.  She reports she is not a smoker.  Patient denies taking any birth control or hormone replacement.  She denies any history of miscarriages.  She denies any personal family history of blood clots or clotting diseases.  She denies any recent surgeries, traumas or infections.  She denies any history of stroke or heart attack.  She denies any history of Crohn's disease or ulcerative colitis.  She denies any history of asthma or cancer.  She denies any varicosities to her lower extremities.  She denies any recent fevers, chills or changes in her health.  Patient reports she is currently a 40H cup.  She states she would like to be a C cup.  Discussed with patient that cup size cannot be guaranteed.  Patient expressed understanding.  Summary of Previous Visit: Patient was seen for initial consult for Dr. Orin Birk in 10/04/2023.  At this visit, patient complained of upper back and neck pain due to her enlarged breasts.  On exam, her resting on the right was 36 cm and her STN on the left was 37 cm.  Her preoperative bra size was an H cup.  The estimated amount of excess breast tissue to be removed at the time of surgery was 1000 g bilaterally.  Estimated excess breast tissue to be removed at time of surgery: 1000 grams  Job: Works as a Advertising account executive, planning to take 2 weeks off  PMH  Significant for: Hypertension, headache disorder, macromastia  Patient's blood pressure is elevated in clinic today.  She denies any new headaches, changes in her vision, chest pain or shortness of breath.  She states that her blood pressure tends to be elevated when she is at the doctor's office.  She does state that she has a blood pressure cuff at home.  I recommended that patient take her blood pressure that when she goes home and if it remains elevated she should contact her primary care provider.  She expressed understanding.   Past Medical History: Allergies: No Known Allergies  Current Medications:  Current Outpatient Medications:    ibuprofen  (ADVIL ) 600 MG tablet, Take 1 tablet (600 mg total) by mouth every 8 (eight) hours as needed for moderate pain (pain score 4-6)., Disp: 30 tablet, Rfl: 3   lisinopril  (ZESTRIL ) 10 MG tablet, Take 1 tablet (10 mg total) by mouth daily., Disp: 90 tablet, Rfl: 3   nortriptyline (PAMELOR) 50 MG capsule, Take 50 mg by mouth at bedtime., Disp: , Rfl:    propranolol (INDERAL) 20 MG tablet, Take 20 mg by mouth in the morning and at bedtime., Disp: , Rfl:    Vitamin D , Ergocalciferol , (DRISDOL ) 1.25 MG (50000 UNIT) CAPS capsule, Take 1 capsule (50,000 Units total) by mouth every 7 (seven) days., Disp: 12 capsule, Rfl: 0  Past Medical Problems:  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;  Service: Orthopedics;  Laterality: Right;    Social History: Social History   Socioeconomic History   Marital status: Significant Other    Spouse name: Not on file   Number of children: 1   Years of education: 12   Highest education level: Associate degree: academic program  Occupational History   Not on file  Tobacco Use   Smoking status: Never    Passive exposure: Never   Smokeless tobacco: Never  Vaping Use   Vaping status: Never Used  Substance and Sexual Activity   Alcohol use: No   Drug use: No   Sexual activity: Yes    Birth control/protection: None    Comment: hx depo and ocp, stopped ocp "years ago"  Other Topics Concern   Not on file  Social History Narrative   Not on file   Social Drivers of Health   Financial Resource Strain: Low Risk  (12/16/2023)   Overall Financial Resource Strain (CARDIA)    Difficulty of Paying Living Expenses: Not hard at all  Food Insecurity: No Food Insecurity (12/16/2023)   Hunger Vital Sign    Worried About Running Out of Food in the Last Year: Never true    Ran Out of Food in the Last Year: Never true  Transportation Needs: No Transportation Needs (12/16/2023)   PRAPARE - Administrator, Civil Service (Medical): No    Lack of Transportation (Non-Medical): No  Physical Activity: Sufficiently Active (12/16/2023)   Exercise Vital Sign    Days of Exercise per Week: 5 days    Minutes of Exercise per Session: 30 min  Stress: No Stress Concern Present (12/16/2023)   [employer] of Occupational Health - Occupational Stress Questionnaire    Feeling of Stress : Not at all  Social Connections: Unknown (12/16/2023)   Social Connection and Isolation Panel [NHANES]    Frequency of Communication with Friends and Family: More than three times a week    Frequency of Social Gatherings with  Friends and Family: More than three times a week    Attends Religious Services: Not on file    Active Member of Clubs or Organizations: Patient declined    Attends Banker Meetings: Patient declined    Marital Status: Never married  Intimate Partner Violence: Not At Risk (12/16/2023)   Humiliation, Afraid, Rape, and Kick questionnaire    Fear of Current or Ex-Partner: No    Emotionally Abused: No    Physically Abused: No    Sexually Abused: No    Family History: Family History  Problem Relation Age of Onset   Healthy Mother    Hypertension Father    Healthy Brother    Asthma Son    Breast cancer  Maternal Grandmother    Hypertension Maternal Grandmother     Review of Systems: Denies any recent fevers, chills or changes in her health  Physical Exam: Vital Signs BP (!) 155/106 (BP Location: Left Arm, Patient Position: Sitting, Cuff Size: Large)   Pulse 79   Ht 5\' 4"  (1.626 m)   Wt 242 lb (109.8 kg)   LMP 11/29/2023   SpO2 97%   BMI 41.54 kg/m   Physical Exam  Constitutional:      General: Not in acute distress.    Appearance: Normal appearance. Not ill-appearing.  HENT:     Head: Normocephalic and atraumatic.  Neck:     Musculoskeletal: Normal range of motion.  Cardiovascular:     Rate and Rhythm: Normal rate Pulmonary:     Effort: Pulmonary effort is normal. No respiratory distress.  Musculoskeletal: Normal range of motion.  Skin:    General: Skin is warm and dry.     Findings: No erythema or rash.  Neurological:     Mental Status: Alert and oriented to person, place, and time. Mental status is at baseline.  Psychiatric:        Mood and Affect: Mood normal.        Behavior: Behavior normal.    Assessment/Plan: The patient is scheduled for bilateral breast reduction with Dr. Orin Birk.  Risks, benefits, and alternatives of procedure discussed, questions answered and consent obtained.    Smoking Status: Nonsmoker ; Counseling Given? NA Last  Mammogram: 12/16/2023; Results: BI-RADS Category 1 negative  Caprini Score: 4; Risk Factors include: BMI > 40 and length of planned surgery. Recommendation for mechanical  prophylaxis. Encourage early ambulation.   Pictures obtained: @consult   Post-op Rx sent to pharmacy: Oxycodone , Zofran , Keflex   Instructed patient to hold her ibuprofen  1 week prior to surgery.  Instructed her to hold her lisinopril  and nortriptyline the day of surgery.  Patient expressed understanding.  Patient was provided with the breast reduction and General Surgical Risk consent document and Pain Medication Agreement prior to their appointment.  They had adequate time to read through the risk consent documents and Pain Medication Agreement. We also discussed them in person together during this preop appointment. All of their questions were answered to their satisfaction.  Recommended calling if they have any further questions.  Risk consent form and Pain Medication Agreement to be scanned into patient's chart.  The risk that can be encountered with breast reduction were discussed and include the following but not limited to these:  Breast asymmetry, fluid accumulation, firmness of the breast, inability to breast feed, loss of nipple or areola, skin loss, decrease or no nipple sensation, fat necrosis of the breast tissue, bleeding, infection, healing delay.  There are risks of anesthesia, changes to skin sensation and injury to nerves or blood vessels.  The muscle can be temporarily or permanently injured.  You may have an allergic reaction to tape, suture, glue, blood products which can result in skin discoloration, swelling, pain, skin lesions, poor healing.  Any of these can lead to the need for revisonal surgery or stage procedures.  A reduction has potential to interfere with diagnostic procedures.  Nipple or breast piercing can increase risks of infection.  This procedure is best done when the breast is fully developed.   Changes in the breast will continue to occur over time.  Pregnancy can alter the outcomes of previous breast reduction surgery, weight gain and weigh loss can also effect the long term appearance.  Electronically signed by: Harden Leyden, PA-C 01/05/2024 10:32 AM

## 2024-01-10 ENCOUNTER — Other Ambulatory Visit: Payer: Self-pay | Admitting: Unknown Physician Specialty

## 2024-01-10 DIAGNOSIS — E041 Nontoxic single thyroid nodule: Secondary | ICD-10-CM

## 2024-01-10 NOTE — Telephone Encounter (Signed)
 Thank you :)

## 2024-01-10 NOTE — Telephone Encounter (Signed)
 Good Afternoon,   Would one of you mind re-faxing this patient's FMLA form please?  Thank you very much,  Martha Grant

## 2024-01-12 ENCOUNTER — Other Ambulatory Visit: Payer: Self-pay

## 2024-01-12 ENCOUNTER — Encounter (HOSPITAL_BASED_OUTPATIENT_CLINIC_OR_DEPARTMENT_OTHER): Payer: Self-pay | Admitting: Plastic Surgery

## 2024-01-12 NOTE — Progress Notes (Signed)
 Patient for US  guided FNA RT Lower Thyroid  Nodule Biopsy on Friday 01/13/24, I called and spoke with the patient on the phone and gave pre-procedure instructions. Pt was made aware to be here at 2p and check in at the [facility] registration desk. Pt stated understanding.  Called 01/10/24

## 2024-01-13 ENCOUNTER — Ambulatory Visit
Admission: RE | Admit: 2024-01-13 | Discharge: 2024-01-13 | Disposition: A | Discharge status: Home / Self Care | Source: Ambulatory Visit | Attending: Unknown Physician Specialty | Admitting: Unknown Physician Specialty

## 2024-01-13 ENCOUNTER — Other Ambulatory Visit: Payer: Self-pay | Admitting: Unknown Physician Specialty

## 2024-01-13 ENCOUNTER — Encounter (HOSPITAL_BASED_OUTPATIENT_CLINIC_OR_DEPARTMENT_OTHER)
Admission: RE | Admit: 2024-01-13 | Discharge: 2024-01-13 | Disposition: A | Discharge status: Home / Self Care | Source: Ambulatory Visit | Attending: Plastic Surgery | Admitting: Plastic Surgery

## 2024-01-13 DIAGNOSIS — E041 Nontoxic single thyroid nodule: Secondary | ICD-10-CM

## 2024-01-13 DIAGNOSIS — Z01810 Encounter for preprocedural cardiovascular examination: Secondary | ICD-10-CM | POA: Diagnosis present

## 2024-01-13 DIAGNOSIS — I10 Essential (primary) hypertension: Secondary | ICD-10-CM | POA: Diagnosis not present

## 2024-01-13 MED ORDER — LIDOCAINE HCL (PF) 1 % IJ SOLN
5.0000 mL | Freq: Once | INTRAMUSCULAR | Status: AC
  Administered 2024-01-13: 5 mL via INTRADERMAL
  Filled 2024-01-13: qty 5

## 2024-01-13 NOTE — Discharge Instructions (Signed)
 Reviewed w/ pt

## 2024-01-16 ENCOUNTER — Encounter: Payer: Self-pay | Admitting: Plastic Surgery

## 2024-01-16 LAB — CYTOLOGY - NON PAP

## 2024-01-18 ENCOUNTER — Other Ambulatory Visit: Payer: Self-pay

## 2024-01-18 ENCOUNTER — Ambulatory Visit (HOSPITAL_BASED_OUTPATIENT_CLINIC_OR_DEPARTMENT_OTHER): Admitting: Anesthesiology

## 2024-01-18 ENCOUNTER — Encounter (HOSPITAL_BASED_OUTPATIENT_CLINIC_OR_DEPARTMENT_OTHER)
Admission: RE | Disposition: A | Discharge status: Home / Self Care | Payer: Self-pay | Source: Home / Self Care | Attending: Plastic Surgery

## 2024-01-18 ENCOUNTER — Ambulatory Visit (HOSPITAL_BASED_OUTPATIENT_CLINIC_OR_DEPARTMENT_OTHER)
Admission: RE | Admit: 2024-01-18 | Discharge: 2024-01-18 | Disposition: A | Discharge status: Home / Self Care | Attending: Plastic Surgery | Admitting: Plastic Surgery

## 2024-01-18 ENCOUNTER — Encounter: Payer: Self-pay | Admitting: Plastic Surgery

## 2024-01-18 ENCOUNTER — Encounter (HOSPITAL_BASED_OUTPATIENT_CLINIC_OR_DEPARTMENT_OTHER): Payer: Self-pay | Admitting: Plastic Surgery

## 2024-01-18 DIAGNOSIS — E66813 Obesity, class 3: Secondary | ICD-10-CM | POA: Insufficient documentation

## 2024-01-18 DIAGNOSIS — I10 Essential (primary) hypertension: Secondary | ICD-10-CM | POA: Diagnosis not present

## 2024-01-18 DIAGNOSIS — M542 Cervicalgia: Secondary | ICD-10-CM | POA: Insufficient documentation

## 2024-01-18 DIAGNOSIS — N62 Hypertrophy of breast: Secondary | ICD-10-CM | POA: Insufficient documentation

## 2024-01-18 DIAGNOSIS — R519 Headache, unspecified: Secondary | ICD-10-CM | POA: Diagnosis not present

## 2024-01-18 DIAGNOSIS — Z79899 Other long term (current) drug therapy: Secondary | ICD-10-CM | POA: Insufficient documentation

## 2024-01-18 DIAGNOSIS — Z6841 Body Mass Index (BMI) 40.0 and over, adult: Secondary | ICD-10-CM | POA: Insufficient documentation

## 2024-01-18 DIAGNOSIS — M549 Dorsalgia, unspecified: Secondary | ICD-10-CM | POA: Insufficient documentation

## 2024-01-18 DIAGNOSIS — Z01818 Encounter for other preprocedural examination: Secondary | ICD-10-CM

## 2024-01-18 DIAGNOSIS — Z803 Family history of malignant neoplasm of breast: Secondary | ICD-10-CM | POA: Diagnosis not present

## 2024-01-18 HISTORY — DX: Essential (primary) hypertension: I10

## 2024-01-18 HISTORY — PX: BREAST REDUCTION SURGERY: SHX8

## 2024-01-18 LAB — POCT PREGNANCY, URINE: Preg Test, Ur: NEGATIVE

## 2024-01-18 SURGERY — BREAST REDUCTION WITH LIPOSUCTION
Anesthesia: General | Site: Breast | Laterality: Bilateral

## 2024-01-18 MED ORDER — SODIUM CHLORIDE 0.9% FLUSH: 3.0000 mL | INTRAVENOUS | Status: DC | PRN

## 2024-01-18 MED ORDER — EPHEDRINE SULFATE (PRESSORS) 50 MG/ML IJ SOLN
INTRAMUSCULAR | Status: DC | PRN
  Administered 2024-01-18: 5 mg via INTRAVENOUS

## 2024-01-18 MED ORDER — HYDROMORPHONE HCL 1 MG/ML IJ SOLN
INTRAMUSCULAR | Status: AC
  Filled 2024-01-18: qty 0.5

## 2024-01-18 MED ORDER — VASHE WOUND IRRIGATION OPTIME
TOPICAL | Status: DC | PRN
Start: 2024-01-18 — End: 2024-01-18
  Administered 2024-01-18: 34 [oz_av]

## 2024-01-18 MED ORDER — LIDOCAINE-EPINEPHRINE 1 %-1:100000 IJ SOLN
INTRAMUSCULAR | Status: DC | PRN
  Administered 2024-01-18: 45 mL

## 2024-01-18 MED ORDER — OXYCODONE HCL 5 MG PO TABS
ORAL_TABLET | ORAL | Status: AC
  Filled 2024-01-18: qty 1

## 2024-01-18 MED ORDER — LACTATED RINGERS IV SOLN: INTRAVENOUS | Status: DC

## 2024-01-18 MED ORDER — LIDOCAINE HCL (CARDIAC) PF 100 MG/5ML IV SOSY
PREFILLED_SYRINGE | INTRAVENOUS | Status: DC | PRN
  Administered 2024-01-18: 80 mg via INTRAVENOUS

## 2024-01-18 MED ORDER — HYDROMORPHONE HCL 1 MG/ML IJ SOLN
INTRAMUSCULAR | Status: DC | PRN
  Administered 2024-01-18: .5 mg via INTRAVENOUS

## 2024-01-18 MED ORDER — PROPOFOL 500 MG/50ML IV EMUL
INTRAVENOUS | Status: DC | PRN
Start: 2024-01-18 — End: 2024-01-18
  Administered 2024-01-18: 50 ug/kg/min via INTRAVENOUS

## 2024-01-18 MED ORDER — CEFAZOLIN SODIUM-DEXTROSE 2-4 GM/100ML-% IV SOLN
INTRAVENOUS | Status: AC
  Filled 2024-01-18: qty 100

## 2024-01-18 MED ORDER — ACETAMINOPHEN 10 MG/ML IV SOLN
INTRAVENOUS | Status: DC | PRN
  Administered 2024-01-18: 1000 mg via INTRAVENOUS

## 2024-01-18 MED ORDER — ROCURONIUM BROMIDE 10 MG/ML (PF) SYRINGE
PREFILLED_SYRINGE | INTRAVENOUS | Status: AC
  Filled 2024-01-18: qty 20

## 2024-01-18 MED ORDER — ACETAMINOPHEN 325 MG RE SUPP: 650.0000 mg | RECTAL | Status: DC | PRN

## 2024-01-18 MED ORDER — DEXMEDETOMIDINE HCL IN NACL 80 MCG/20ML IV SOLN
INTRAVENOUS | Status: DC | PRN
  Administered 2024-01-18: 4 ug via INTRAVENOUS
  Administered 2024-01-18: 8 ug via INTRAVENOUS

## 2024-01-18 MED ORDER — PROPOFOL 10 MG/ML IV BOLUS
INTRAVENOUS | Status: DC | PRN
Start: 2024-01-18 — End: 2024-01-18
  Administered 2024-01-18: 200 mg via INTRAVENOUS

## 2024-01-18 MED ORDER — SODIUM CHLORIDE 0.9 % IV SOLN: 250.0000 mL | INTRAVENOUS | Status: DC | PRN

## 2024-01-18 MED ORDER — BUPIVACAINE LIPOSOME 1.3 % IJ SUSP
INTRAMUSCULAR | Status: AC
  Filled 2024-01-18: qty 20

## 2024-01-18 MED ORDER — OXYCODONE HCL 5 MG PO TABS
5.0000 mg | ORAL_TABLET | ORAL | Status: DC | PRN
  Administered 2024-01-18: 5 mg via ORAL

## 2024-01-18 MED ORDER — FENTANYL CITRATE (PF) 100 MCG/2ML IJ SOLN
INTRAMUSCULAR | Status: AC
Start: 2024-01-18 — End: 2024-01-18
  Filled 2024-01-18: qty 2

## 2024-01-18 MED ORDER — ACETAMINOPHEN 325 MG PO TABS: 650.0000 mg | ORAL_TABLET | ORAL | Status: DC | PRN

## 2024-01-18 MED ORDER — CHLORHEXIDINE GLUCONATE CLOTH 2 % EX PADS: 6.0000 | MEDICATED_PAD | Freq: Once | CUTANEOUS | Status: DC

## 2024-01-18 MED ORDER — FENTANYL CITRATE (PF) 100 MCG/2ML IJ SOLN
INTRAMUSCULAR | Status: DC | PRN
  Administered 2024-01-18 (×4): 50 ug via INTRAVENOUS

## 2024-01-18 MED ORDER — LIDOCAINE HCL 1 % IJ SOLN
INTRAVENOUS | Status: DC | PRN
  Administered 2024-01-18: 500 mL

## 2024-01-18 MED ORDER — ACETAMINOPHEN 500 MG PO TABS: 1000.0000 mg | ORAL_TABLET | Freq: Once | ORAL | Status: DC

## 2024-01-18 MED ORDER — SODIUM CHLORIDE 0.9% FLUSH: 3.0000 mL | Freq: Two times a day (BID) | INTRAVENOUS | Status: DC

## 2024-01-18 MED ORDER — MIDAZOLAM HCL 2 MG/2ML IJ SOLN
INTRAMUSCULAR | Status: DC | PRN
  Administered 2024-01-18: 2 mg via INTRAVENOUS

## 2024-01-18 MED ORDER — HYDROMORPHONE HCL 1 MG/ML IJ SOLN: 0.2500 mg | INTRAMUSCULAR | Status: DC | PRN

## 2024-01-18 MED ORDER — PROPOFOL 1000 MG/100ML IV EMUL
INTRAVENOUS | Status: AC
  Filled 2024-01-18: qty 100

## 2024-01-18 MED ORDER — SODIUM CHLORIDE 0.9 % IV SOLN
INTRAVENOUS | Status: DC | PRN
  Administered 2024-01-18: 40 mL

## 2024-01-18 MED ORDER — DEXAMETHASONE SODIUM PHOSPHATE 4 MG/ML IJ SOLN
INTRAMUSCULAR | Status: DC | PRN
  Administered 2024-01-18: 10 mg via INTRAVENOUS

## 2024-01-18 MED ORDER — CEFAZOLIN SODIUM-DEXTROSE 2-4 GM/100ML-% IV SOLN
2.0000 g | INTRAVENOUS | Status: AC
  Administered 2024-01-18: 2 g via INTRAVENOUS

## 2024-01-18 MED ORDER — ACETAMINOPHEN 10 MG/ML IV SOLN
INTRAVENOUS | Status: AC
  Filled 2024-01-18: qty 100

## 2024-01-18 MED ORDER — FENTANYL CITRATE (PF) 100 MCG/2ML IJ SOLN: 25.0000 ug | INTRAMUSCULAR | Status: DC | PRN

## 2024-01-18 MED ORDER — MIDAZOLAM HCL 2 MG/2ML IJ SOLN
INTRAMUSCULAR | Status: AC
Start: 2024-01-18 — End: 2024-01-18
  Filled 2024-01-18: qty 2

## 2024-01-18 MED ORDER — ONDANSETRON HCL 4 MG/2ML IJ SOLN
INTRAMUSCULAR | Status: DC | PRN
  Administered 2024-01-18: 4 mg via INTRAVENOUS

## 2024-01-18 MED ORDER — SUGAMMADEX SODIUM 200 MG/2ML IV SOLN
INTRAVENOUS | Status: DC | PRN
  Administered 2024-01-18: 200 mg via INTRAVENOUS

## 2024-01-18 MED ORDER — ROCURONIUM BROMIDE 100 MG/10ML IV SOLN
INTRAVENOUS | Status: DC | PRN
  Administered 2024-01-18: 60 mg via INTRAVENOUS

## 2024-01-18 SURGICAL SUPPLY — 61 items
BINDER BREAST LRG (GAUZE/BANDAGES/DRESSINGS) IMPLANT
BINDER BREAST MEDIUM (GAUZE/BANDAGES/DRESSINGS) IMPLANT
BINDER BREAST XLRG (GAUZE/BANDAGES/DRESSINGS) IMPLANT
BINDER BREAST XXLRG (GAUZE/BANDAGES/DRESSINGS) IMPLANT
BIOPATCH RED 1 DISK 7.0 (GAUZE/BANDAGES/DRESSINGS) IMPLANT
BLADE HEX COATED 2.75 (ELECTRODE) IMPLANT
BLADE KNIFE PERSONA 10 (BLADE) ×2 IMPLANT
BLADE SURG 15 STRL LF DISP TIS (BLADE) ×1 IMPLANT
CANISTER SUCT 1200ML W/VALVE (MISCELLANEOUS) ×1 IMPLANT
CLEANSER WND VASHE 34 (WOUND CARE) ×1 IMPLANT
COVER BACK TABLE 60X90IN (DRAPES) ×1 IMPLANT
COVER MAYO STAND STRL (DRAPES) ×1 IMPLANT
DERMABOND ADVANCED .7 DNX12 (GAUZE/BANDAGES/DRESSINGS) ×2 IMPLANT
DRAIN CHANNEL 15F RND FF W/TCR (WOUND CARE) IMPLANT
DRAIN CHANNEL 19F RND (DRAIN) IMPLANT
DRAPE LAPAROSCOPIC ABDOMINAL (DRAPES) ×1 IMPLANT
DRAPE UTILITY XL STRL (DRAPES) ×1 IMPLANT
DRSG MEPILEX POST OP 4X8 (GAUZE/BANDAGES/DRESSINGS) ×2 IMPLANT
DRSG TEGADERM 4X4.75 (GAUZE/BANDAGES/DRESSINGS) IMPLANT
ELECTRODE BLDE 4.0 EZ CLN MEGD (MISCELLANEOUS) ×1 IMPLANT
ELECTRODE REM PT RTRN 9FT ADLT (ELECTROSURGICAL) ×1 IMPLANT
EVACUATOR SILICONE 100CC (DRAIN) IMPLANT
GAUZE PAD ABD 8X10 STRL (GAUZE/BANDAGES/DRESSINGS) ×2 IMPLANT
GLOVE BIO SURGEON STRL SZ 6.5 (GLOVE) ×2 IMPLANT
GLOVE BIO SURGEON STRL SZ7.5 (GLOVE) ×1 IMPLANT
GLOVE BIOGEL PI IND STRL 7.0 (GLOVE) IMPLANT
GLOVE BIOGEL PI IND STRL 8 (GLOVE) IMPLANT
GOWN STRL REUS W/ TWL LRG LVL3 (GOWN DISPOSABLE) ×2 IMPLANT
GOWN STRL REUS W/ TWL XL LVL3 (GOWN DISPOSABLE) IMPLANT
LINER CANISTER 1000CC FLEX (MISCELLANEOUS) ×1 IMPLANT
NDL FILTER BLUNT 18X1 1/2 (NEEDLE) IMPLANT
NDL HYPO 25X1 1.5 SAFETY (NEEDLE) ×2 IMPLANT
NEEDLE FILTER BLUNT 18X1 1/2 (NEEDLE) ×1 IMPLANT
NEEDLE HYPO 25X1 1.5 SAFETY (NEEDLE) ×2 IMPLANT
NS IRRIG 1000ML POUR BTL (IV SOLUTION) IMPLANT
PACK BASIN DAY SURGERY FS (CUSTOM PROCEDURE TRAY) ×1 IMPLANT
PAD ALCOHOL SWAB (MISCELLANEOUS) IMPLANT
PAD FOAM SILICONE BACKED (GAUZE/BANDAGES/DRESSINGS) IMPLANT
PENCIL SMOKE EVACUATOR (MISCELLANEOUS) ×1 IMPLANT
PIN SAFETY STERILE (MISCELLANEOUS) IMPLANT
POWDER MYRIAD MORCLLS FINE 500 (Miscellaneous) IMPLANT
SLEEVE SCD COMPRESS KNEE MED (STOCKING) ×1 IMPLANT
SPIKE FLUID TRANSFER (MISCELLANEOUS) IMPLANT
SPONGE T-LAP 18X18 ~~LOC~~+RFID (SPONGE) ×2 IMPLANT
STRIP SUTURE WOUND CLOSURE 1/2 (MISCELLANEOUS) ×4 IMPLANT
SUT MNCRL AB 4-0 PS2 18 (SUTURE) ×4 IMPLANT
SUT MON AB 3-0 SH27 (SUTURE) ×4 IMPLANT
SUT MON AB 5-0 PS2 18 (SUTURE) IMPLANT
SUT PDS II 3-0 CT2 27 ABS (SUTURE) ×4 IMPLANT
SUT SILK 3 0 PS 1 (SUTURE) IMPLANT
SYR 50ML LL SCALE MARK (SYRINGE) IMPLANT
SYR BULB IRRIG 60ML STRL (SYRINGE) ×1 IMPLANT
SYR CONTROL 10ML LL (SYRINGE) ×2 IMPLANT
TAPE MEASURE VINYL STERILE (MISCELLANEOUS) IMPLANT
TOWEL GREEN STERILE FF (TOWEL DISPOSABLE) ×3 IMPLANT
TRAY DSU PREP LF (CUSTOM PROCEDURE TRAY) ×1 IMPLANT
TUBE CONNECTING 20X1/4 (TUBING) ×1 IMPLANT
TUBING INFILTRATION IT-10001 (TUBING) IMPLANT
TUBING SET GRADUATE ASPIR 12FT (MISCELLANEOUS) IMPLANT
UNDERPAD 30X36 HEAVY ABSORB (UNDERPADS AND DIAPERS) ×2 IMPLANT
YANKAUER SUCT BULB TIP NO VENT (SUCTIONS) ×1 IMPLANT

## 2024-01-18 NOTE — Op Note (Signed)
 Breast Reduction Op note:    DATE OF PROCEDURE: 01/18/2024  LOCATION: [facility]  SURGEON: Gilles Lacks, DO  ASSISTANT: Lamount Pimple, PA  PREOPERATIVE DIAGNOSIS 1. Macromastia 2. Neck Pain 3. Back Pain  POSTOPERATIVE DIAGNOSIS 1. Macromastia 2. Neck Pain 3. Back Pain  PROCEDURES 1. Bilateral breast reduction.  Right reduction 1206 g, Left reduction 1207 g  COMPLICATIONS: None.  DRAINS: none  INDICATIONS FOR PROCEDURE Martha Grant is a 43 y.o. year-old female born on 1981-02-03,with a history of symptomatic macromastia with concomitant back pain, neck pain, shoulder grooving from her bra.   MRN: [id number]  CONSENT Informed consent was obtained directly from the patient. The risks, benefits and alternatives were fully discussed. Specific risks including but not limited to bleeding, infection, hematoma, seroma, scarring, pain, nipple necrosis, asymmetry, poor cosmetic results, and need for further surgery were discussed. The patient's questions were answered.  DESCRIPTION OF PROCEDURE  Patient was brought into the operating room and rested on the operating room table in the supine position.  SCDs were placed and appropriate padding was performed.  Antibiotics were given. The patient underwent general anesthesia and the chest was prepped and draped in a sterile fashion.  A timeout was performed and all information was confirmed to be correct by those in the room. Tumescent was placed in the lateral breast on each side.  Liposuction was done for improved contour.  Right side: Preoperative markings were confirmed.  Incision lines were injected with local containing epinephrine.  After waiting for vasoconstriction, the marked lines were incised with a #15 blade.  A Wise-pattern superomedial breast reduction was performed by de-epithelializing the pedicle, using bovie to create the superomedial pedicle, and removing breast tissue from the lateral and  inferior portions of the breast.  Care was taken to not undermine the breast pedicle. Myriad and experel were placed in the pocket. Hemostasis was achieved.  The nipple was gently rotated into position and the soft tissue closed with 4-0 Monocryl.   The pocket was irrigated and hemostasis confirmed.  The deep tissues were approximated with 3-0 PDS sutures.  The skin was closed with deep dermal 3-0 Monocryl and subcuticular 4-0 Monocryl sutures.  The nipple and skin flaps had good capillary refill at the end of the procedure.    Left side: Preoperative markings were confirmed.  Incision lines were injected with local containing epinephrine.  After waiting for vasoconstriction, the marked lines were incised with a #15 blade.  A Wise-pattern superomedial breast reduction was performed by de-epithelializing the pedicle, using bovie to create the superomedial pedicle, and removing breast tissue from the lateral and inferior portions of the breast.  Care was taken to not undermine the breast pedicle. Hemostasis was achieved.  Myriad and experel were placed in the pocket. The nipple was gently rotated into position and the soft tissue was closed with 4-0 Monocryl.  The patient was sat upright and size and shape symmetry was confirmed.  The pocket was irrigated and hemostasis confirmed.  The deep tissues were approximated with 3-0 PDS sutures. The skin was closed with deep dermal 3-0 Monocryl and subcuticular 4-0 Monocryl sutures.  Dermabond was applied.  A breast binder and ABDs were placed.  The nipple and skin flaps had good capillary refill at the end of the procedure.  The patient tolerated the procedure well. The patient was allowed to wake from anesthesia and taken to the recovery room in satisfactory condition.  The advanced practice practitioner (APP) assisted throughout the  case.  The APP was essential in retraction and counter traction when needed to make the case progress smoothly.  This retraction and  assistance made it possible to see the tissue plans for the procedure.  The assistance was needed for blood control, tissue re-approximation and assisted with closure of the incision site.

## 2024-01-18 NOTE — Anesthesia Preprocedure Evaluation (Addendum)
 Anesthesia Evaluation  Patient identified by MRN, date of birth, ID band Patient awake    Reviewed: Allergy & Precautions, H&P , NPO status , Patient's Chart, lab work & pertinent test results  Airway Mallampati: III  TM Distance: >3 FB Neck ROM: Full    Dental no notable dental hx. (+) Teeth Intact, Dental Advisory Given   Pulmonary neg pulmonary ROS   Pulmonary exam normal breath sounds clear to auscultation       Cardiovascular hypertension, Pt. on medications and Pt. on home beta blockers  Rhythm:Regular Rate:Normal     Neuro/Psych  Headaches  negative psych ROS   GI/Hepatic negative GI ROS, Neg liver ROS,,,  Endo/Other    Class 3 obesity  Renal/GU negative Renal ROS  negative genitourinary   Musculoskeletal   Abdominal   Peds  Hematology negative hematology ROS (+)   Anesthesia Other Findings   Reproductive/Obstetrics negative OB ROS                             Anesthesia Physical Anesthesia Plan  ASA: 3  Anesthesia Plan: General   Post-op Pain Management: Tylenol  PO (pre-op)*   Induction: Intravenous  PONV Risk Score and Plan: 4 or greater and Ondansetron , Dexamethasone  and Midazolam   Airway Management Planned: Oral ETT  Additional Equipment:   Intra-op Plan:   Post-operative Plan: Extubation in OR  Informed Consent: I have reviewed the patients History and Physical, chart, labs and discussed the procedure including the risks, benefits and alternatives for the proposed anesthesia with the patient or authorized representative who has indicated his/her understanding and acceptance.     Dental advisory given  Plan Discussed with: CRNA  Anesthesia Plan Comments:        Anesthesia Quick Evaluation

## 2024-01-18 NOTE — Anesthesia Postprocedure Evaluation (Signed)
 Anesthesia Post Note  Patient: Martha Grant  Procedure(s) Performed: BREAST REDUCTION WITH LIPOSUCTION (Bilateral: Breast)     Patient location during evaluation: PACU Anesthesia Type: General Level of consciousness: awake and alert Pain management: pain level controlled Vital Signs Assessment: post-procedure vital signs reviewed and stable Respiratory status: spontaneous breathing, nonlabored ventilation and respiratory function stable Cardiovascular status: blood pressure returned to baseline and stable Postop Assessment: no apparent nausea or vomiting Anesthetic complications: no   No notable events documented.  Last Vitals:  Vitals:   01/18/24 1314 01/18/24 1326  BP: (!) 155/88 (!) 145/83  Pulse: 68 76  Resp: 20 16  Temp:  (!) 36.2 C  SpO2: 95% 97%    Last Pain:  Vitals:   01/18/24 1402  TempSrc:   PainSc: 4                  Earvin Goldberg

## 2024-01-18 NOTE — Transfer of Care (Signed)
 Immediate Anesthesia Transfer of Care Note  Patient: Chamara Dyck Hendler  Procedure(s) Performed: BREAST REDUCTION WITH LIPOSUCTION (Bilateral: Breast)  Patient Location: PACU  Anesthesia Type:General  Level of Consciousness: awake, alert , oriented, and patient cooperative  Airway & Oxygen Therapy: Patient Spontanous Breathing and Patient connected to nasal cannula oxygen  Post-op Assessment: Report given to RN and Post -op Vital signs reviewed and stable  Post vital signs: Reviewed and stable  Last Vitals:  Vitals Value Taken Time  BP 140/85 01/18/24 1240  Temp 36.1 C 01/18/24 1240  Pulse 70 01/18/24 1242  Resp 13 01/18/24 1242  SpO2 96 % 01/18/24 1242  Vitals shown include unfiled device data.  Last Pain:  Vitals:   01/18/24 0739  TempSrc: Oral  PainSc: 0-No pain      Patients Stated Pain Goal: 4 (01/18/24 0739)  Complications: No notable events documented.

## 2024-01-18 NOTE — Discharge Instructions (Addendum)
 INSTRUCTIONS FOR AFTER BREAST SURGERY   You will likely have some questions about what to expect following your operation.  The following information will help you and your family understand what to expect when you are discharged from the hospital.  It is important to follow these guidelines to help ensure a smooth recovery and reduce complication.  Postoperative instructions include information on: diet, wound care, medications and physical activity.  AFTER SURGERY Expect to go home after the procedure.  In some cases, you may need to spend one night in the hospital for observation.  DIET Breast surgery does not require a specific diet.  However, the healthier you eat the better your body will heal. It is important to increasing your protein intake.  This means limiting the foods with sugar and carbohydrates.  Focus on vegetables and some meat.  If you have liposuction during your procedure be sure to drink water.  If your urine is bright yellow, then it is concentrated, and you need to drink more water.  As a general rule after surgery, you should have 8 ounces of water every hour while awake.  If you find you are persistently nauseated or unable to take in liquids let us  know.  NO TOBACCO USE or EXPOSURE.  This will slow your healing process and lead to a wound.  WOUND CARE Leave the binder on for 3 days . Use fragrance free soap like Dial, Dove or [place].   After 3 days you can remove the binder to shower. Once dry apply binder or sports bra. If you have liposuction you will have a soft and spongy dressing (Lipofoam) that helps prevent creases in your skin.  Remove before you shower and then replace it.  It is also available on [employer]. If you have steri-strips / tape directly attached to your skin leave them in place. It is OK to get these wet.   No baths, pools or hot tubs for four weeks. We close your incision to leave the smallest and best-looking scar. No ointment or creams on your incisions  for four weeks.  No Neosporin (Too many skin reactions).  A few weeks after surgery you can use Mederma and start massaging the scar. We ask you to wear your binder or sports bra for the first 6 weeks around the clock, including while sleeping. This provides added comfort and helps reduce the fluid accumulation at the surgery site. NO Ice or heating pads to the operative site.  You have a very high risk of a BURN before you feel the temperature change.  ACTIVITY No heavy lifting until cleared by the doctor.  This usually means no more than a half-gallon of milk.  It is OK to walk and climb stairs. Moving your legs is very important to decrease your risk of a blood clot.  It will also help keep you from getting deconditioned.  Every 1 to 2 hours get up and walk for 5 minutes. This will help with a quicker recovery back to normal.  Let pain be your guide so you don't do too much.  This time is for you to recover.  You will be more comfortable if you sleep and rest with your head elevated either with a few pillows under you or in a recliner.  No stomach sleeping for a three months.  WORK Everyone returns to work at different times. As a rough guide, most people take at least 1 - 2 weeks off prior to returning to work. If  you need documentation for your job, give the forms to the front staff at the clinic.  DRIVING Arrange for someone to bring you home from the hospital after your surgery.  You may be able to drive a few days after surgery but not while taking any narcotics or valium.  BOWEL MOVEMENTS Constipation can occur after anesthesia and while taking pain medication.  It is important to stay ahead for your comfort.  We recommend taking Milk of Magnesia (2 tablespoons; twice a day) while taking the pain pills.  MEDICATIONS You may be prescribed should start after surgery At your preoperative visit for you history and physical you may have been given the following medications: An antibiotic: Start  this medication when you get home and take according to the instructions on the bottle. Zofran  4 mg:  This is to treat nausea and vomiting.  You can take this every 6 hours as needed and only if needed. Valium 2 mg for breast cancer patients: This is for muscle tightness if you have an implant or expander. This will help relax your muscle which also helps with pain control.  This can be taken every 12 hours as needed. Don't drive after taking this medication. Norco (hydrocodone/acetaminophen ) 5/325 mg:  This is only to be used after you have taken the Motrin  or the Tylenol . Every 8 hours as needed.   Over the counter Medication to take: Ibuprofen  (Motrin ) 600 mg:  Take this every 6 hours.  If you have additional pain then take 500 mg of the Tylenol  every 8 hours.  Only take the Norco after you have tried these two. MiraLAX or Milk of Magnesia: Take this according to the bottle if you take the Norco.  WHEN TO CALL Call your surgeon's office if any of the following occur: Fever 101 degrees F or greater Excessive bleeding or fluid from the incision site. Pain that increases over time without aid from the medications Redness, warmth, or pus draining from incision sites Persistent nausea or inability to take in liquids Severe misshapen area that underwent the operation.    Post Anesthesia Home Care Instructions  Activity: Get plenty of rest for the remainder of the day. A responsible individual must stay with you for 24 hours following the procedure.  For the next 24 hours, DO NOT: -Drive a car -Advertising copywriter -Drink alcoholic beverages -Take any medication unless instructed by your physician -Make any legal decisions or sign important papers.  Meals: Start with liquid foods such as gelatin or soup. Progress to regular foods as tolerated. Avoid greasy, spicy, heavy foods. If nausea and/or vomiting occur, drink only clear liquids until the nausea and/or vomiting subsides. Call your  physician if vomiting continues.  Special Instructions/Symptoms: Your throat may feel dry or sore from the anesthesia or the breathing tube placed in your throat during surgery. If this causes discomfort, gargle with warm salt water. The discomfort should disappear within 24 hours.  If you had a scopolamine patch placed behind your ear for the management of post- operative nausea and/or vomiting:  1. The medication in the patch is effective for 72 hours, after which it should be removed.  Wrap patch in a tissue and discard in the trash. Wash hands thoroughly with soap and water. 2. You may remove the patch earlier than 72 hours if you experience unpleasant side effects which may include dry mouth, dizziness or visual disturbances. 3. Avoid touching the patch. Wash your hands with soap and water after contact with  the patch.    Information for Discharge Teaching: EXPAREL (bupivacaine liposome injectable suspension)   Pain relief is important to your recovery. The goal is to control your pain so you can move easier and return to your normal activities as soon as possible after your procedure. Your physician may use several types of medicines to manage pain, swelling, and more.  Your surgeon or anesthesiologist gave you EXPAREL(bupivacaine) to help control your pain after surgery.  EXPAREL is a local anesthetic designed to release slowly over an extended period of time to provide pain relief by numbing the tissue around the surgical site. EXPAREL is designed to release pain medication over time and can control pain for up to 72 hours. Depending on how you respond to EXPAREL, you may require less pain medication during your recovery. EXPAREL can help reduce or eliminate the need for opioids during the first few days after surgery when pain relief is needed the most. EXPAREL is not an opioid and is not addictive. It does not cause sleepiness or sedation.   Important! A teal colored band has been  placed on your arm with the date, time and amount of EXPAREL you have received. Please leave this armband in place for the full 96 hours following administration, and then you may remove the band. If you return to the hospital for any reason within 96 hours following the administration of EXPAREL, the armband provides important information that your health care providers to know, and alerts them that you have received this anesthetic.    Possible side effects of EXPAREL: Temporary loss of sensation or ability to move in the area where medication was injected. Nausea, vomiting, constipation Rarely, numbness and tingling in your mouth or lips, lightheadedness, or anxiety may occur. Call your doctor right away if you think you may be experiencing any of these sensations, or if you have other questions regarding possible side effects.  Follow all other discharge instructions given to you by your surgeon or nurse. Eat a healthy diet and drink plenty of water or other fluids.   Next dose of Tylenol  may be given at 4:10pm if needed.

## 2024-01-18 NOTE — Anesthesia Procedure Notes (Signed)
 Procedure Name: Intubation Date/Time: 01/18/2024 10:01 AM  Performed by: Lonia Ro, CRNAPre-anesthesia Checklist: Patient identified, Emergency Drugs available, Suction available, Patient being monitored and Timeout performed Patient Re-evaluated:Patient Re-evaluated prior to induction Oxygen Delivery Method: Circle system utilized Preoxygenation: Pre-oxygenation with 100% oxygen Induction Type: IV induction Ventilation: Mask ventilation without difficulty Laryngoscope Size: Mac and 3 Grade View: Grade I Tube type: Oral Tube size: 7.0 mm Number of attempts: 1 Airway Equipment and Method: Stylet Placement Confirmation: ETT inserted through vocal cords under direct vision, positive ETCO2 and breath sounds checked- equal and bilateral Secured at: 22 cm Tube secured with: Tape Dental Injury: Teeth and Oropharynx as per pre-operative assessment

## 2024-01-18 NOTE — Interval H&P Note (Signed)
 History and Physical Interval Note:  01/18/2024 7:39 AM  Martha Grant  has presented today for surgery, with the diagnosis of Hypertrophy of breast.  The various methods of treatment have been discussed with the patient and family. After consideration of risks, benefits and other options for treatment, the patient has consented to  Procedure(s): BREAST REDUCTION WITH LIPOSUCTION (Bilateral) as a surgical intervention.  The patient's history has been reviewed, patient examined, no change in status, stable for surgery.  I have reviewed the patient's chart and labs.  Questions were answered to the patient's satisfaction.     Lindaann Requena Lizbeth Feijoo

## 2024-01-19 ENCOUNTER — Encounter (HOSPITAL_BASED_OUTPATIENT_CLINIC_OR_DEPARTMENT_OTHER): Payer: Self-pay | Admitting: Plastic Surgery

## 2024-01-23 ENCOUNTER — Other Ambulatory Visit: Payer: Self-pay | Admitting: Student

## 2024-01-25 NOTE — Progress Notes (Signed)
 Patient is a 43 year old female who underwent bilateral breast reduction with Dr. Orin Birk on 01/18/2024.  Patient is a little over 1 week postop.  She presents to the clinic today for postoperative follow-up.  Today,

## 2024-01-27 ENCOUNTER — Ambulatory Visit: Admitting: Student

## 2024-01-27 ENCOUNTER — Encounter: Payer: Self-pay | Admitting: Student

## 2024-01-27 VITALS — BP 145/85 | HR 77

## 2024-01-27 DIAGNOSIS — N62 Hypertrophy of breast: Secondary | ICD-10-CM

## 2024-02-06 ENCOUNTER — Other Ambulatory Visit: Payer: Self-pay | Admitting: Unknown Physician Specialty

## 2024-02-06 DIAGNOSIS — E041 Nontoxic single thyroid nodule: Secondary | ICD-10-CM

## 2024-02-08 ENCOUNTER — Telehealth: Payer: Self-pay

## 2024-02-08 NOTE — Telephone Encounter (Signed)
 I called this patient in regards to her MyChart message. She stated that she had some drainage and there was an odor. The patient denied any fevers or bright red drainage. I let her know that the odor and some draining is a normal finding. I answered all of her questions and the patient expressed understanding. She has an appointment tomorrow and I told her to call us  if anything changes or she has further questions.

## 2024-02-09 ENCOUNTER — Encounter: Payer: Self-pay | Admitting: Student

## 2024-02-09 ENCOUNTER — Telehealth: Payer: Self-pay

## 2024-02-09 ENCOUNTER — Ambulatory Visit: Admitting: Student

## 2024-02-09 VITALS — BP 161/89 | HR 64 | Resp 96 | Ht 64.0 in | Wt >= 6400 oz

## 2024-02-09 DIAGNOSIS — N62 Hypertrophy of breast: Secondary | ICD-10-CM

## 2024-02-09 NOTE — Progress Notes (Signed)
 Patient is a 43 year old female who underwent bilateral breast reduction with Dr. Lowery on 01/18/2024.  Patient is about 3 weeks postop.  She presents to the clinic today for postoperative follow-up.     Patient was last seen in the clinic on 01/27/2024.  At this visit, patient was doing well.  On exam, breasts are soft and symmetric.  NAC's were healthy.  There were no signs of infection on exam.  Today, patient reports she is overall doing well.  She states that she has noticed some drainage from her right breast.  She states that has a bad smell to it and that it started up on Saturday.  She states that it is just a little bit on the dressings and that it is not pouring out of her.  She denies any fevers or chills.  She denies any redness over the area.  She denies any other issues or concerns at this time.  Chaperone present on exam.  On exam, patient is sitting upright in no acute distress.  Breasts are overall soft and symmetric.  There was a little bit of firmness noted just laterally to the right NAC, consistent with fat necrosis versus scar tissue.  There is also a little bit of firmness noted to the inferior aspects of the breast bilaterally consistent with scar tissue.  There is no overlying erythema.  No obvious fluid collections on exam.  NAC's are healthy bilaterally.  There is an approximately 2 cm x 1.5 cm wound noted to the right inferior T-zone.  There is exudate throughout the wound.  There is no active drainage on exam.  Remainder of the incisions are intact and healing well.  Several Monocryl sutures were cut and removed.  Patient tolerated well.  There were no signs of infection on exam.  Discussed with the patient that she has a wound to her right inferior T-zone.  Discussed with her that this will most likely get a little bit worse before it gets better.  Recommended that she clean the area with Vashe and apply Xeroform daily over the area.  Recommended that she apply Vaseline  throughout the remainder of her incisions.  Patient expressed understanding.  Recommended that she massage the areas of firmness to her breast bilaterally.  Discussed with her that she should continue with compression at all times and avoid strenuous activities.  Patient to follow back up in about 2 weeks.  I instructed her to call in the meantime she has any questions or concerns about anything.

## 2024-02-09 NOTE — Telephone Encounter (Signed)
Faxed wound care supply request to [facility] with confirmed receipt.

## 2024-02-24 ENCOUNTER — Ambulatory Visit: Admitting: Student

## 2024-02-24 ENCOUNTER — Encounter: Payer: Self-pay | Admitting: Student

## 2024-02-24 VITALS — BP 168/98 | HR 70

## 2024-02-24 DIAGNOSIS — N62 Hypertrophy of breast: Secondary | ICD-10-CM

## 2024-02-24 NOTE — Progress Notes (Signed)
 Patient is a 43 year old female who underwent bilateral breast reduction with Dr. Lowery on 01/18/2024.  She is a little over 5 weeks postop.  She presents to the clinic today for postoperative follow-up.  Patient was last seen in the clinic on 02/09/2024.  At this visit, patient was doing well.  On exam, breasts were overall soft and symmetric.  There was a little bit of firmness noted just laterally of the right NAC and to the inferior aspects of the breast bilaterally.  NAC's were healthy.  There was an approximately 2 cm x 1.5 cm wound noted to the right inferior T-zone.  There was exudate throughout the wound.  Recommended that patient apply Vashe and apply a little bit of Xeroform over her wound daily and massage the areas of firmness to her breasts.  Today, patient reports she is doing well.  She states that sometimes she gets intermittent burning pains that quickly go away.  She otherwise denies any issues or concerns.  She reports that she has been applying Xeroform to her right breast wound daily.  She does not report any fevers or chills.  Denies any other issues or concerns.  Chaperone present on exam.  On exam, patient is sitting upright in no acute distress.  Breasts are overall soft and symmetric.  There is a little bit of firmness noted to the right lateral breast, it does appear to be improving from previous exam.  There is no overlying erythema.  There are no obvious fluid collections on exam.  NAC's are healthy bilaterally.  The wound to the right inferior T-zone has improved significantly.  It is about 1 cm x 0.25 cm in size with some exudate noted within the wound.  There is no surrounding erythema or drainage.  Remainder of the incisions are well-healed.  There were a few Monocryl sutures that were cut and removed.  Patient tolerated well.  There are no signs of infection on exam.  I discussed with the patient she may now transition to just Vaseline on her wound daily followed by a  dressing.  Patient expressed understanding.  Recommended that patient continue to massage the area of firmness to her right breast.  Discussed with patient that as of next week, she no longer has to wear compression and may transition into a regular bra with no underwire.  Also discussed with the patient that she may start gradually increasing her activities next week.  Patient expressed understanding.  I would like to see the patient back in about 3 to 4 weeks.  I instructed her to call in the meantime she has any questions or concerns about anything.  Pictures were obtained of the patient and placed in the chart with the patient's or guardian's permission.

## 2024-03-16 ENCOUNTER — Ambulatory Visit (INDEPENDENT_AMBULATORY_CARE_PROVIDER_SITE_OTHER): Admitting: Student

## 2024-03-16 VITALS — BP 155/100 | HR 66

## 2024-03-16 DIAGNOSIS — N62 Hypertrophy of breast: Secondary | ICD-10-CM

## 2024-03-16 NOTE — Progress Notes (Signed)
 Patient is a 43 year old female who underwent bilateral breast reduction with Dr. Lowery on 01/18/2024.  Patient is about 2 months postop.  She presents to the clinic today for postoperative follow-up.     Patient was last seen in the clinic on 02/24/2024.  At this visit, patient was doing well.  On exam, breasts were overall soft and symmetric.  There was a little bit of firmness noted to the right lateral breast which appeared to be improving.  There were no fluid collections.  NAC's were healthy.  There was a wound to the right inferior T-zone.  Today, patient reports she is doing well.  She states that she still has a little bit of firmness to her right lateral breast, which she states has been softening up.  She denies any fevers or chills.  She does not have any other issues or concerns at this time.  Chaperone present on exam.  On exam, patient is sitting upright in no acute distress.  Breasts are overall soft and symmetric.  There is still some firmness noted to the right lateral breast, consistent with fat necrosis.  There are no obvious fluid collections on exam.  No signs of infection.  NAC's are healthy.  Wounds to the incisions appear to be completely healed.  There are no signs of infection on exam.  I recommended that patient continue to massage the area of firmness and continue to monitor the area.  I discussed with the patient that if the area becomes larger or more firm or if she has concerns about it she should let us  know.  Patient expressed understanding.  I discussed with the patient that she may start using scar creams to her incisions and that she no longer has restrictions.  She expressed understanding.  Patient to follow back up in about 3 months if firmness to her right breast is still present.  I discussed with her that if the firmness has completely resolved, she may cancel that appointment.  She expressed understanding was in agreement with this.  Otherwise patient to  follow-up as needed.  Patient's blood pressure is elevated in clinic today.  She denies any new symptoms.  I recommended that she check her blood pressure when she gets home.  Discussed with her that if it remains elevated she needs to recheck to her primary care provider.  Patient expressed understanding.  I instructed the patient to call if she has any other questions or concerns about anything.

## 2024-05-07 ENCOUNTER — Emergency Department
Admission: EM | Admit: 2024-05-07 | Discharge: 2024-05-07 | Disposition: A | Discharge status: Home / Self Care | Attending: Emergency Medicine | Admitting: Emergency Medicine

## 2024-05-07 ENCOUNTER — Other Ambulatory Visit: Payer: Self-pay

## 2024-05-07 DIAGNOSIS — R09A2 Foreign body sensation, throat: Secondary | ICD-10-CM | POA: Diagnosis present

## 2024-05-07 DIAGNOSIS — I10 Essential (primary) hypertension: Secondary | ICD-10-CM | POA: Insufficient documentation

## 2024-05-07 MED ORDER — MAGIC MOUTHWASH W/LIDOCAINE: 10.0000 mL | Freq: Four times a day (QID) | ORAL | 0 refills | Status: AC | PRN

## 2024-05-07 MED ORDER — LIDOCAINE VISCOUS HCL 2 % MT SOLN
15.0000 mL | Freq: Once | OROMUCOSAL | Status: AC
  Administered 2024-05-07: 15 mL via OROMUCOSAL
  Filled 2024-05-07: qty 15

## 2024-05-07 NOTE — Discharge Instructions (Signed)
 Soft foods until sensation resolves.

## 2024-05-07 NOTE — ED Triage Notes (Signed)
 Pt reports shortly after taking a pill she began to feel like something was stuck in her throat, pt denies difficulty swallowing or breathing. Pt states she has drank water and ate some food to try and help feeling subside but has had no relief.

## 2024-05-07 NOTE — ED Provider Notes (Signed)
   [facility] Provider Note    Event Date/Time   First MD Initiated Contact with Patient 05/07/24 2040     (approximate)   History   Foreign Body   HPI  Martha Grant is a 43 y.o. female with history of hypertension, PCOS and as listed in EMR presents to the emergency department for treatment and evaluation of sensation that something is stuck in her throat after swallowing a pill.  She has had no difficulty swallowing or breathing.  She is able to tolerate water and food without vomiting.SABRA     Physical Exam    Vitals:   05/07/24 2008 05/07/24 2227  BP: (!) 191/113 (S) (!) 186/115  Pulse: 73 78  Resp: 18 18  Temp: 98.4 F (36.9 C) 98.2 F (36.8 C)  SpO2: 100% 98%    General: Awake, no distress.  CV:  Good peripheral perfusion.  Resp:  Normal effort.  Abd:  No distention.  Other:  Speech is clear. Swallowing without difficulty.   ED Results / Procedures / Treatments   Labs (all labs ordered are listed, but only abnormal results are displayed)  Labs Reviewed - No data to display   EKG     RADIOLOGY  Image and radiology report reviewed and interpreted by me. Radiology report consistent with the same.  Not indicated.  PROCEDURES:  Critical Care performed: No  Procedures   MEDICATIONS ORDERED IN ED:  Medications  lidocaine  (XYLOCAINE ) 2 % viscous mouth solution 15 mL (15 mLs Mouth/Throat Given 05/07/24 2053)     IMPRESSION / MDM / ASSESSMENT AND PLAN / ED COURSE   I have reviewed the triage note and vital signs. Vital signs indicate asymptomatic hypertension with history of the same.   Differential diagnosis includes, but is not limited to, globus hystericus, dysphagia, esophageal abrasion  Patient's presentation is most consistent with acute, uncomplicated illness.  43 year old female presenting to the emergency department for sensation of something stuck in her throat after swallowing a tablet.  See HPI for further  details.  Patient is swallowing, eating and drinking without difficulty.  Viscous lidocaine  given with some relief of symptoms.  Reassurance was provided and she was given a prescription for Magic mouthwash.  Outpatient follow-up and ER return precautions discussed.      FINAL CLINICAL IMPRESSION(S) / ED DIAGNOSES   Final diagnoses:  Globus sensation     Rx / DC Orders   ED Discharge Orders          Ordered    magic mouthwash w/lidocaine  SOLN  4 times daily PRN        05/07/24 2200             Note:  This document was prepared using Dragon voice recognition software and may include unintentional dictation errors.   Herlinda Kirk NOVAK, FNP 05/07/24 2329    Arlander Charleston, MD 05/07/24 2330

## 2024-05-14 ENCOUNTER — Ambulatory Visit: Admitting: Student

## 2024-05-18 ENCOUNTER — Telehealth: Payer: Self-pay | Admitting: Plastic Surgery

## 2024-05-18 NOTE — Telephone Encounter (Signed)
 Called and left a VM, she has an appt on 10-13 w/Claire Elenteny, please R/S

## 2024-05-21 ENCOUNTER — Ambulatory Visit: Admitting: Student

## 2024-05-28 ENCOUNTER — Ambulatory Visit (INDEPENDENT_AMBULATORY_CARE_PROVIDER_SITE_OTHER): Admitting: Student

## 2024-05-28 DIAGNOSIS — Z9889 Other specified postprocedural states: Secondary | ICD-10-CM

## 2024-05-28 DIAGNOSIS — N6459 Other signs and symptoms in breast: Secondary | ICD-10-CM

## 2024-05-28 NOTE — Progress Notes (Signed)
   Referring Provider Bernardo Fend, DO [address] [place],  [place] [address]   CC: Follow up     Martha Grant is an 43 y.o. female.  HPI: Patient is a 43 year old female who underwent bilateral breast reduction with Dr. Lowery on 01/18/2024.  Patient is about 4 months postop.  She presents to the clinic today for further follow-up.  Patient was last seen in the clinic on 03/16/2024.  At this visit, patient was doing well.  On exam, breasts were overall soft and symmetric, there was still some firmness noted of the right lateral breast consistent with fat necrosis.  NAC's were healthy.  Recommended continued massage to the area and continued to have her monitor it.  Today, patient reports she is doing well.  She states that the firmness to her right breast is still there.  She states that she has been massaging twice a day.  She denies the area of firmness getting any bigger.  She states that it is not really getting any smaller though.  She denies any pain in the area.  She states that it is not bothering her.  She denies any other issues or concerns.  She denies any fevers or chills.  Review of Systems General: Denies any fevers or chills  Physical Exam    05/07/2024   10:27 PM 05/07/2024    8:08 PM 03/16/2024    9:46 AM  Vitals with BMI  Height  5' 4   Weight  240 lbs   BMI  41.18   Systolic 186  191 155  Diastolic 115  113 100  Pulse 78 73      Significant value    General:  No acute distress,  Alert and oriented, Non-Toxic, Normal speech and affect Chaperone present on exam.  On exam, patient is sitting upright in no acute distress.  Breasts are overall soft and symmetric.  There is a distinct area of firmness noted to the central/lateral aspect of the right breast.  It feels consistent with fat necrosis.  There is no overlying erythema to either breast.  No obvious fluid collections on exam.  NAC's appear to be healthy.  No signs of  infection.  Assessment/Plan  S/P bilateral breast reduction - Plan: US  BREAST COMPLETE UNI RIGHT INC AXILLA   Discussed with the patient that I suspect the area of firmness is fat necrosis, but did recommend we get an ultrasound at this time to evaluate the firmness.  She expressed understanding was in agreement with this.  Recommended that she continue to massage the area.  Will plan to do a telephone visit in about 2 to 3 weeks to discuss the results of the ultrasound.  Instructed her to call back in the meantime she has any questions or concerns about anything.  Martha Grant 05/28/2024, 3:54 PM

## 2024-06-05 ENCOUNTER — Other Ambulatory Visit: Payer: Self-pay | Admitting: Student

## 2024-06-05 DIAGNOSIS — Z9889 Other specified postprocedural states: Secondary | ICD-10-CM

## 2024-06-11 ENCOUNTER — Ambulatory Visit (INDEPENDENT_AMBULATORY_CARE_PROVIDER_SITE_OTHER): Admitting: Student

## 2024-06-11 DIAGNOSIS — Z9889 Other specified postprocedural states: Secondary | ICD-10-CM

## 2024-06-11 NOTE — Progress Notes (Signed)
 Patient still has not had ultrasound completed, she is scheduled for 06/27/2024.  We will plan to do a telephone visit after she has completed the ultrasound.  She was in agreement with this.

## 2024-06-13 ENCOUNTER — Ambulatory Visit: Admitting: Student

## 2024-06-27 ENCOUNTER — Ambulatory Visit
Admission: RE | Admit: 2024-06-27 | Discharge: 2024-06-27 | Disposition: A | Discharge status: Home / Self Care | Source: Ambulatory Visit | Attending: Student | Admitting: Student

## 2024-06-27 ENCOUNTER — Ambulatory Visit

## 2024-06-27 DIAGNOSIS — Z9889 Other specified postprocedural states: Secondary | ICD-10-CM

## 2024-06-29 ENCOUNTER — Ambulatory Visit (INDEPENDENT_AMBULATORY_CARE_PROVIDER_SITE_OTHER): Admitting: Student

## 2024-06-29 DIAGNOSIS — Z9889 Other specified postprocedural states: Secondary | ICD-10-CM

## 2024-06-29 NOTE — Progress Notes (Signed)
   Referring Provider Bernardo Fend, DO [address] [place],  [place] [address]   CC: Follow up     Martha Grant is an 43 y.o. female.  HPI: Patient is a 43 year old female who underwent bilateral breast reduction with Dr. Lowery on 01/18/2024.  She presents for telephone visit today to discuss the results of her ultrasound.  Patient was last seen in the clinic on 05/28/2024.  At this visit, patient was overall doing well.  On exam, there was a distinct area of firmness noted to the central/lateral aspect of the right breast.  It felt consistent with fat necrosis.  Recommended that she massage the area and ultrasound was ordered.  Per chart review, results of the imaging showed the following: Benign reduction changes of the right breast. Benign 2.6 cm oil cyst at site of patient's palpable area of concern.  Today, patient reports she is doing well.  She states that the firmness is still present.  She states that she has been massaging every morning and every night.  She does not report any other issues or concerns.  Review of Systems General: Denies any changes in health  Physical Exam Speaking in full and clear sentences   Assessment/Plan S/P bilateral breast reduction   Recommended that patient continue with massage of the area.  Recommended that she follow back up in our clinic in about 2 to 3 months for reevaluation.  Patient expressed understanding.  I instructed the patient to call if she has any questions or concerns about anything.  The patient gave consent to have this visit done by telemedicine / virtual visit, two identifiers were used to identify patient. This is also consent for access the chart and treat the patient via this visit. The patient is located at home.  I, the provider, am at the office.  We spent 3 minutes together for the visit.  Joined by telephone.   Martha Grant 06/29/2024, 1:58 PM

## 2024-09-11 ENCOUNTER — Ambulatory Visit: Admitting: Plastic Surgery

## 2024-09-11 VITALS — BP 143/98 | HR 82 | Wt 236.6 lb

## 2024-09-11 DIAGNOSIS — N641 Fat necrosis of breast: Secondary | ICD-10-CM

## 2024-09-11 DIAGNOSIS — M7989 Other specified soft tissue disorders: Secondary | ICD-10-CM | POA: Insufficient documentation

## 2024-09-11 DIAGNOSIS — N62 Hypertrophy of breast: Secondary | ICD-10-CM

## 2024-09-11 NOTE — Progress Notes (Signed)
 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  Consent was confirmed and the patient understands the risks and benefits.  The potential complications and alternatives were explained and the patient consents.  The patient expressed understanding the option of not having the procedure and the risks of a scar.  Time out was called and all information was confirmed to be correct.    The area was prepped with chlorhexidine .  Lidocaine  1% with epinephrine  0.1 cc was mixed with Kenalog 50 over 5 mg 0.3 cc.  The mixture was then injected into the area of the fat necrosis that was deep and not superficial.  The patient was given instructions on how to care for the area and a follow up appointment.  Martha Grant tolerated the procedure well and there were no complications.

## 2025-09-13 ENCOUNTER — Ambulatory Visit: Admitting: Plastic Surgery
